# Patient Record
Sex: Male | Born: 1975 | Race: White | Hispanic: No | Smoking: Never smoker
Health system: Southern US, Community
[De-identification: ages and names within clinical notes are randomized; demographics above are authoritative.]

## PROBLEM LIST (undated history)

## (undated) DIAGNOSIS — R569 Unspecified convulsions: Secondary | ICD-10-CM

## (undated) DIAGNOSIS — N200 Calculus of kidney: Secondary | ICD-10-CM

## (undated) DIAGNOSIS — I5189 Other ill-defined heart diseases: Secondary | ICD-10-CM

## (undated) DIAGNOSIS — S42309A Unspecified fracture of shaft of humerus, unspecified arm, initial encounter for closed fracture: Secondary | ICD-10-CM

## (undated) DIAGNOSIS — R55 Syncope and collapse: Secondary | ICD-10-CM

## (undated) DIAGNOSIS — R011 Cardiac murmur, unspecified: Secondary | ICD-10-CM

## (undated) DIAGNOSIS — Z87448 Personal history of other diseases of urinary system: Secondary | ICD-10-CM

## (undated) HISTORY — DX: Cardiac murmur, unspecified: R01.1

## (undated) HISTORY — DX: Unspecified convulsions: R56.9

## (undated) HISTORY — DX: Unspecified fracture of shaft of humerus, unspecified arm, initial encounter for closed fracture: S42.309A

## (undated) HISTORY — DX: Syncope and collapse: R55

## (undated) HISTORY — DX: Other ill-defined heart diseases: I51.89

## (undated) HISTORY — DX: Calculus of kidney: N20.0

---
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
## 2006-10-08 ENCOUNTER — Ambulatory Visit: Payer: Self-pay | Admitting: Family Medicine

## 2006-12-13 ENCOUNTER — Ambulatory Visit: Payer: Self-pay | Admitting: Family Medicine

## 2011-05-31 ENCOUNTER — Ambulatory Visit (INDEPENDENT_AMBULATORY_CARE_PROVIDER_SITE_OTHER): Payer: Commercial Managed Care - PPO | Admitting: Family Medicine

## 2011-05-31 ENCOUNTER — Encounter: Payer: Self-pay | Admitting: Family Medicine

## 2011-05-31 VITALS — BP 120/78 | HR 72 | Temp 97.7°F | Ht 71.0 in | Wt 201.4 lb

## 2011-05-31 DIAGNOSIS — Z0000 Encounter for general adult medical examination without abnormal findings: Secondary | ICD-10-CM

## 2011-05-31 DIAGNOSIS — L739 Follicular disorder, unspecified: Secondary | ICD-10-CM

## 2011-05-31 DIAGNOSIS — Z13220 Encounter for screening for lipoid disorders: Secondary | ICD-10-CM

## 2011-05-31 DIAGNOSIS — R011 Cardiac murmur, unspecified: Secondary | ICD-10-CM | POA: Insufficient documentation

## 2011-05-31 DIAGNOSIS — Z131 Encounter for screening for diabetes mellitus: Secondary | ICD-10-CM

## 2011-05-31 DIAGNOSIS — H579 Unspecified disorder of eye and adnexa: Secondary | ICD-10-CM

## 2011-05-31 DIAGNOSIS — H5789 Other specified disorders of eye and adnexa: Secondary | ICD-10-CM

## 2011-05-31 NOTE — Progress Notes (Signed)
Jay Price, a 35 y.o. male presents today in the office for the following:   Complete physical examination as well as some other acute issues.  Preventative Health Maintenance Visit:  Health Maintenance Summary Reviewed and updated, unless pt declines services.  Tobacco History Reviewed. Alcohol: No concerns, no excessive use Exercise Habits: Some activity, rec at least 30 mins 5 times a week. Plays basketball several times a week. Otherwise physically active as well. STD concerns: no risk or activity to increase risk Drug Use: None Encouraged self-testicular check  No lab work in 4 or 5 years. New family history of colon cancer or prostate cancer.   1. Folliculitis: Has some spots on his arms and legs. A few weeks ago.  At this point, they have Carney Bern the way, and there is some minimal postinflammatory tissue present, but no acute infection. Worsens on his left thigh.  2. Eye irritation: R eye is staying red. R eye will be blood red. 6-8 weeks.  Went for about a week without contacts.  Eye MD appt - going to get No known trauma or injury. It is irritated, and his route but quite readily wakes up in the morning compared to the left, but both do have some conjunctival irritation.  3. 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  Diagnosed on echocardiogram, has not had any followup since then. Does get short winded and lightheaded sometimes when he is exercising.  General: Denies fever, chills, sweats. No significant weight loss. Eyes: above ENT: Denies earache, sore throat, and hoarseness. Cardiovascular: Denies chest pains, palpitations, but dyspnea on exertion as above Respiratory: Denies cough, dyspnea at rest,wheeezing Breast: no concerns about lumps GI: Denies nausea, vomiting, diarrhea, constipation, change in bowel habits, abdominal pain, melena, hematochezia GU: Denies penile discharge, ED, urinary flow / outflow problems. No STD  concerns. Musculoskeletal: Denies back pain, joint pain Derm: above Neuro: Denies  paresthesias, frequent falls, frequent headaches Psych: Denies depression, anxiety Endocrine: Denies cold intolerance, heat intolerance, polydipsia Heme: Denies enlarged lymph nodes Allergy: No hayfever   Physical Exam  Blood pressure 120/78, pulse 72, temperature 97.7 F (36.5 C), temperature source Oral, height 5\' 11"  (1.803 m), weight 201 lb 6.4 oz (91.354 kg), SpO2 98.00%.  PE: GEN: well developed, well nourished, no acute distress Eyes: conjunctiva and lids normal, PERRLA, EOMI ENT: TM clear, nares clear, oral exam WNL Neck: supple, no lymphadenopathy, no thyromegaly, no JVD Pulm: clear to auscultation and percussion, respiratory effort normal CV: regular rate and rhythm, S1-S2, no murmur, rub or gallop, no bruits, peripheral pulses normal and symmetric, no cyanosis, clubbing, edema or varicosities Chest: no scars, masses, no gynecomastia   GI: soft, non-tender; no hepatosplenomegaly, masses; active bowel sounds all quadrants GU: no hernia, testicular mass, penile discharge Lymph: no cervical, axillary or inguinal adenopathy MSK: gait normal, muscle tone and strength WNL, no joint swelling, effusions, discoloration, crepitus  SKIN: Scattered areas of healing small reddened elevations. No sign of occult infection currently  Neuro: normal mental status, normal strength, sensation, and motion Psych: alert; oriented to person, place and time, normally interactive and not anxious or depressed in appearance.  Assessment and Plan: 1.  health maintenance: The patient's preventative maintenance and recommended screening tests for an annual wellness exam were reviewed in full today. Brought up to date unless services declined.  Counselled on the importance of diet, exercise, and its role in overall health and mortality. The patient's FH and SH was reviewed, including their  home life, tobacco status, and  drug and alcohol status.   Check basic FLP and BMP  2. a history of abnormal valve functionality: Given his ongoing occasional shortness of breath, and his history, with an unclear diagnosis, it is very reasonable we'll follow this up with an echocardiogram to see if his valvular functionality has altered over the years.  3. Folliculitis, healed, reassured. If returns, he can use some Neosporin or triple antibiotic ointment.  4. Right eye irritation: Fluoroscein dye was placed into the patient's eye without any difficulty, and the patient tolerated this well. Ultraviolet light displayed no defect in the patient's eye. Given this ongoing problem, and redness in the eye, he is ago followup with his ophthalmologist, which I think is very prudent.

## 2011-05-31 NOTE — Patient Instructions (Signed)
REFERRAL: GO THE THE FRONT ROOM AT THE ENTRANCE OF OUR CLINIC, NEAR CHECK IN. ASK FOR MARION. SHE WILL HELP YOU SET UP YOUR REFERRAL. DATE: TIME:  

## 2011-06-01 LAB — BASIC METABOLIC PANEL
BUN: 18 mg/dL (ref 6–23)
CO2: 26 mEq/L (ref 19–32)
Chloride: 104 mEq/L (ref 96–112)
Creatinine, Ser: 1.1 mg/dL (ref 0.4–1.5)

## 2011-06-08 ENCOUNTER — Other Ambulatory Visit (INDEPENDENT_AMBULATORY_CARE_PROVIDER_SITE_OTHER): Payer: Commercial Managed Care - PPO | Admitting: *Deleted

## 2011-06-08 DIAGNOSIS — R011 Cardiac murmur, unspecified: Secondary | ICD-10-CM

## 2013-04-09 ENCOUNTER — Other Ambulatory Visit: Payer: Self-pay | Admitting: Family Medicine

## 2013-04-09 DIAGNOSIS — Z13220 Encounter for screening for lipoid disorders: Secondary | ICD-10-CM

## 2013-04-09 DIAGNOSIS — R5383 Other fatigue: Secondary | ICD-10-CM

## 2013-04-15 ENCOUNTER — Other Ambulatory Visit (INDEPENDENT_AMBULATORY_CARE_PROVIDER_SITE_OTHER): Payer: Commercial Managed Care - PPO

## 2013-04-15 DIAGNOSIS — Z13220 Encounter for screening for lipoid disorders: Secondary | ICD-10-CM

## 2013-04-15 DIAGNOSIS — R5381 Other malaise: Secondary | ICD-10-CM

## 2013-04-15 LAB — LIPID PANEL
Cholesterol: 188 mg/dL (ref 0–200)
HDL: 37.2 mg/dL — ABNORMAL LOW (ref >39.00)
LDL Cholesterol: 136 mg/dL — ABNORMAL HIGH (ref 0–99)
VLDL: 15.2 mg/dL (ref 0.0–40.0)

## 2013-04-15 LAB — CBC WITH DIFFERENTIAL/PLATELET
Basophils Absolute: 0 10*3/uL (ref 0.0–0.1)
Basophils Relative: 0.7 % (ref 0.0–3.0)
Eosinophils Absolute: 0.2 10*3/uL (ref 0.0–0.7)
Lymphocytes Relative: 35.8 % (ref 12.0–46.0)
MCHC: 33.3 g/dL (ref 30.0–36.0)
Neutrophils Relative %: 51.2 % (ref 43.0–77.0)
Platelets: 251 10*3/uL (ref 150.0–400.0)
RBC: 5.09 Mil/uL (ref 4.22–5.81)

## 2013-04-15 LAB — BASIC METABOLIC PANEL
CO2: 30 mEq/L (ref 19–32)
Calcium: 9.4 mg/dL (ref 8.4–10.5)
Creatinine, Ser: 1.1 mg/dL (ref 0.4–1.5)
GFR: 81.18 mL/min (ref >60.00)
Sodium: 138 mEq/L (ref 135–145)

## 2013-04-15 LAB — HEPATIC FUNCTION PANEL
Alkaline Phosphatase: 73 U/L (ref 39–117)
Bilirubin, Direct: 0.1 mg/dL (ref 0.0–0.3)
Total Bilirubin: 0.6 mg/dL (ref 0.3–1.2)

## 2013-04-22 ENCOUNTER — Ambulatory Visit (INDEPENDENT_AMBULATORY_CARE_PROVIDER_SITE_OTHER): Payer: Commercial Managed Care - PPO | Admitting: Family Medicine

## 2013-04-22 ENCOUNTER — Encounter: Payer: Self-pay | Admitting: Family Medicine

## 2013-04-22 VITALS — BP 120/64 | HR 64 | Temp 97.7°F | Ht 71.0 in | Wt 193.5 lb

## 2013-04-22 DIAGNOSIS — R1011 Right upper quadrant pain: Secondary | ICD-10-CM

## 2013-04-22 DIAGNOSIS — Z0000 Encounter for general adult medical examination without abnormal findings: Secondary | ICD-10-CM

## 2013-04-22 NOTE — Addendum Note (Signed)
Addended by: Consuello Masse on: 04/22/2013 10:05 AM   Modules accepted: Orders

## 2013-04-22 NOTE — Patient Instructions (Addendum)
REFERRAL: GO THE THE FRONT ROOM AT THE ENTRANCE OF OUR CLINIC, NEAR CHECK IN. ASK FOR MARION. SHE WILL HELP YOU SET UP YOUR REFERRAL. DATE: TIME:  

## 2013-04-22 NOTE — Progress Notes (Signed)
[facility] [address] [place] [address] Phone: [phone] Fax: [phone]  Date:  04/22/2013   Name:  Jay Price   DOB:  03-21-1976   MRN:  [id number] Gender: male Age: 37 y.o.  Primary Physician:  Hannah Beat, MD  Evaluating MD: Hannah Beat, MD   Chief Complaint: Annual Exam   History of Present Illness:  Jay Price is a 37 y.o. pleasant patient who presents with the following:  CPX:  Pzst few months has been having pains under his ribcage and will occaisionally hurt under rib cage. Couple of months. Few days ok, then shooting pain. Fast food will sometimes make it worse.   Preventative Health Maintenance Visit:  Health Maintenance Summary Reviewed and updated, unless pt declines services.  Tobacco History Reviewed. Alcohol: No concerns, no excessive use Exercise Habits: Some activity, rec at least 30 mins 5 times a week STD concerns: no risk or activity to increase risk Drug Use: None Encouraged self-testicular check  Health Maintenance  Topic Date Due  . Tetanus/tdap  11/29/1995  . Influenza Vaccine  07/20/2013    Labs reviewed with the patient.  Results for orders placed in visit on 04/15/13  LIPID PANEL      Result Value Range   Cholesterol 188  0 - 200 mg/dL   Triglycerides 24.4  0.0 - 149.0 mg/dL   HDL 01.02 (*) >72.53 mg/dL   VLDL 66.4  0.0 - 40.3 mg/dL   LDL Cholesterol 474 (*) 0 - 99 mg/dL   Total CHOL/HDL Ratio 5    CBC WITH DIFFERENTIAL      Result Value Range   WBC 6.3  4.5 - 10.5 K/uL   RBC 5.09  4.22 - 5.81 Mil/uL   Hemoglobin 15.2  13.0 - 17.0 g/dL   HCT 25.9  56.3 - 87.5 %   MCV 89.6  78.0 - 100.0 fl   MCHC 33.3  30.0 - 36.0 g/dL   RDW 64.3  32.9 - 51.8 %   Platelets 251.0  150.0 - 400.0 K/uL   Neutrophils Relative % 51.2  43.0 - 77.0 %   Lymphocytes Relative 35.8  12.0 - 46.0 %   Monocytes Relative 9.4  3.0 - 12.0 %   Eosinophils Relative 2.9  0.0 - 5.0 %   Basophils  Relative 0.7  0.0 - 3.0 %   Neutro Abs 3.2  1.4 - 7.7 K/uL   Lymphs Abs 2.2  0.7 - 4.0 K/uL   Monocytes Absolute 0.6  0.1 - 1.0 K/uL   Eosinophils Absolute 0.2  0.0 - 0.7 K/uL   Basophils Absolute 0.0  0.0 - 0.1 K/uL  HEPATIC FUNCTION PANEL      Result Value Range   Total Bilirubin 0.6  0.3 - 1.2 mg/dL   Bilirubin, Direct 0.1  0.0 - 0.3 mg/dL   Alkaline Phosphatase 73  39 - 117 U/L   AST 23  0 - 37 U/L   ALT 39  0 - 53 U/L   Total Protein 7.6  6.0 - 8.3 g/dL   Albumin 4.1  3.5 - 5.2 g/dL  BASIC METABOLIC PANEL      Result Value Range   Sodium 138  135 - 145 mEq/L   Potassium 4.5  3.5 - 5.1 mEq/L   Chloride 105  96 - 112 mEq/L   CO2 30  19 - 32 mEq/L   Glucose, Bld 108 (*) 70 - 99 mg/dL  BUN 14  6 - 23 mg/dL   Creatinine, Ser 1.1  0.4 - 1.5 mg/dL   Calcium 9.4  8.4 - 82.9 mg/dL   GFR 56.21  >30.86 mL/min    Patient Active Problem List   Diagnosis Date Noted  . Murmur, cardiac 05/31/2011    Past Medical History  Diagnosis Date  . Heart murmur     "leaky valve" seen on Echo in past  . Seizures     Distantly, had about 5 seizures, usually associated with great fatigue and strain. Last 10 years ago. Tonic-clonic activity described. Post-ictal state.  . Arm fracture     distant    No past surgical history on file.  History   Social History  . Marital Status: Married    Spouse Name: N/A    Number of Children: N/A  . Years of Education: N/A   Occupational History  . Not on file.   Social History Main Topics  . Smoking status: Never Smoker   . Smokeless tobacco: Not on file  . Alcohol Use: No  . Drug Use: No  . Sexually Active: Not on file   Other Topics Concern  . Not on file   Social History Narrative  . No narrative on file    No family history on file.  No Known Allergies  Medication list has been reviewed and updated.  No outpatient prescriptions prior to visit.   No facility-administered medications prior to visit.    Review of  Systems:   General: Denies fever, chills, sweats. No significant weight loss. Eyes: Denies blurring,significant itching ENT: Denies earache, sore throat, and hoarseness. Cardiovascular: Denies chest pains, palpitations, dyspnea on exertion Respiratory: Denies cough, dyspnea at rest,wheeezing Breast: no concerns about lumps GI: as above GU: Denies penile discharge, ED, urinary flow / outflow problems. No STD concerns. Musculoskeletal: Denies back pain, joint pain Derm: Denies rash, itching Neuro: Denies  paresthesias, frequent falls, frequent headaches Psych: Denies depression, anxiety Endocrine: Denies cold intolerance, heat intolerance, polydipsia Heme: Denies enlarged lymph nodes Allergy: No hayfever   Physical Examination: BP 120/64  Pulse 64  Temp(Src) 97.7 F (36.5 C) (Oral)  Ht 5\' 11"  (1.803 m)  Wt 193 lb 8 oz (87.771 kg)  BMI 27 kg/m2  SpO2 98%  Ideal Body Weight: Weight in (lb) to have BMI = 25: 178.9  GEN: well developed, well nourished, no acute distress Eyes: conjunctiva and lids normal, PERRLA, EOMI ENT: TM clear, nares clear, oral exam WNL Neck: supple, no lymphadenopathy, no thyromegaly, no JVD Pulm: clear to auscultation and percussion, respiratory effort normal CV: regular rate and rhythm, S1-S2, no murmur, rub or gallop, no bruits, peripheral pulses normal and symmetric, no cyanosis, clubbing, edema or varicosities GI: soft, non-tender; no hepatosplenomegaly, masses; active bowel sounds all quadrants GU: no hernia, testicular mass, penile discharge Lymph: no cervical, axillary or inguinal adenopathy MSK: gait normal, muscle tone and strength WNL, no joint swelling, effusions, discoloration, crepitus  SKIN: clear, good turgor, color WNL, no rashes, lesions, or ulcerations Neuro: normal mental status, normal strength, sensation, and motion Psych: alert; oriented to person, place and time, normally interactive and not anxious or depressed in  appearance.  Assessment and Plan:  Routine general medical examination at a health care facility  Abdominal pain, right upper quadrant - Plan: US Abdomen Complete  The patient's preventative maintenance and recommended screening tests for an annual wellness exam were reviewed in full today. Brought up to date unless services declined.  Counselled on  the importance of diet, exercise, and its role in overall health and mortality. The patient's FH and SH was reviewed, including their home life, tobacco status, and drug and alcohol status.   Work on losing weight and exercise.   RUQ abd intermittent pain, obtain u/s to eval for potential gallstones  Orders Today:  Orders Placed This Encounter  Procedures  . US Abdomen Complete    Standing Status: Future     Number of Occurrences:      Standing Expiration Date: 06/22/2014    Order Specific Question:  Reason for exam:    Answer:  concern for gallstones    Order Specific Question:  Preferred imaging location?    Answer:  [id number]    Updated Medication List: (Includes new medications, updates to list, dose adjustments) No orders of the defined types were placed in this encounter.    Medications Discontinued: There are no discontinued medications.    Signed, Elpidio Galea. Kaiven Vester, MD 04/22/2013 8:49 AM

## 2013-05-11 ENCOUNTER — Ambulatory Visit: Payer: Self-pay | Admitting: Family Medicine

## 2013-05-12 ENCOUNTER — Encounter: Payer: Self-pay | Admitting: Family Medicine

## 2013-05-13 ENCOUNTER — Telehealth: Payer: Self-pay | Admitting: Family Medicine

## 2013-05-13 NOTE — Telephone Encounter (Signed)
Patient advised as requested 

## 2013-05-13 NOTE — Telephone Encounter (Signed)
Please call and let the patient know that his ultrasound was completely normal. Normal gallbladder. I am not sure I would do much. He could do a trial of Zantac for a few weeks bid to see if this helps.

## 2014-12-17 ENCOUNTER — Other Ambulatory Visit: Payer: Self-pay | Admitting: Family Medicine

## 2014-12-17 DIAGNOSIS — Z13220 Encounter for screening for lipoid disorders: Secondary | ICD-10-CM

## 2014-12-17 DIAGNOSIS — Z79899 Other long term (current) drug therapy: Secondary | ICD-10-CM

## 2014-12-20 ENCOUNTER — Other Ambulatory Visit (INDEPENDENT_AMBULATORY_CARE_PROVIDER_SITE_OTHER): Payer: Commercial Managed Care - PPO

## 2014-12-20 DIAGNOSIS — Z13220 Encounter for screening for lipoid disorders: Secondary | ICD-10-CM

## 2014-12-20 DIAGNOSIS — Z79899 Other long term (current) drug therapy: Secondary | ICD-10-CM

## 2014-12-20 LAB — LIPID PANEL
CHOL/HDL RATIO: 4
CHOLESTEROL: 157 mg/dL (ref 0–200)
HDL: 39.3 mg/dL (ref >39.00)
LDL CALC: 91 mg/dL (ref 0–99)
NonHDL: 117.7
TRIGLYCERIDES: 132 mg/dL (ref 0.0–149.0)
VLDL: 26.4 mg/dL (ref 0.0–40.0)

## 2014-12-20 LAB — CBC WITH DIFFERENTIAL/PLATELET
BASOS PCT: 0.5 % (ref 0.0–3.0)
Basophils Absolute: 0 10*3/uL (ref 0.0–0.1)
EOS ABS: 0.2 10*3/uL (ref 0.0–0.7)
EOS PCT: 2.6 % (ref 0.0–5.0)
HCT: 42.3 % (ref 39.0–52.0)
HEMOGLOBIN: 14.5 g/dL (ref 13.0–17.0)
LYMPHS PCT: 32.1 % (ref 12.0–46.0)
Lymphs Abs: 2.3 10*3/uL (ref 0.7–4.0)
MCHC: 34.3 g/dL (ref 30.0–36.0)
MCV: 87.6 fl (ref 78.0–100.0)
Monocytes Absolute: 0.7 10*3/uL (ref 0.1–1.0)
Monocytes Relative: 10.4 % (ref 3.0–12.0)
NEUTROS PCT: 54.4 % (ref 43.0–77.0)
Neutro Abs: 3.9 10*3/uL (ref 1.4–7.7)
Platelets: 270 10*3/uL (ref 150.0–400.0)
RBC: 4.82 Mil/uL (ref 4.22–5.81)
RDW: 13.2 % (ref 11.5–15.5)
WBC: 7.2 10*3/uL (ref 4.0–10.5)

## 2014-12-20 LAB — BASIC METABOLIC PANEL
BUN: 16 mg/dL (ref 6–23)
CO2: 30 mEq/L (ref 19–32)
CREATININE: 1 mg/dL (ref 0.40–1.50)
Calcium: 9.3 mg/dL (ref 8.4–10.5)
Chloride: 105 mEq/L (ref 96–112)
GFR: 88.85 mL/min (ref >60.00)
Glucose, Bld: 93 mg/dL (ref 70–99)
POTASSIUM: 4.2 meq/L (ref 3.5–5.1)
Sodium: 140 mEq/L (ref 135–145)

## 2014-12-20 LAB — HEPATIC FUNCTION PANEL
ALBUMIN: 4.2 g/dL (ref 3.5–5.2)
ALK PHOS: 72 U/L (ref 39–117)
ALT: 38 U/L (ref 0–53)
AST: 25 U/L (ref 0–37)
BILIRUBIN DIRECT: 0.1 mg/dL (ref 0.0–0.3)
BILIRUBIN TOTAL: 0.6 mg/dL (ref 0.2–1.2)
TOTAL PROTEIN: 7.1 g/dL (ref 6.0–8.3)

## 2014-12-27 ENCOUNTER — Encounter: Payer: Self-pay | Admitting: Family Medicine

## 2014-12-27 ENCOUNTER — Ambulatory Visit (INDEPENDENT_AMBULATORY_CARE_PROVIDER_SITE_OTHER): Payer: Commercial Managed Care - PPO | Admitting: Family Medicine

## 2014-12-27 VITALS — BP 110/70 | HR 65 | Temp 97.4°F | Ht 71.5 in | Wt 184.5 lb

## 2014-12-27 DIAGNOSIS — Z0000 Encounter for general adult medical examination without abnormal findings: Secondary | ICD-10-CM

## 2014-12-27 NOTE — Progress Notes (Signed)
Pre visit review using our clinic review tool, if applicable. No additional management support is needed unless otherwise documented below in the visit note. 

## 2014-12-27 NOTE — Progress Notes (Signed)
 Dr. Spencer T. Copland, MD, [facility] [address] [place], [address] Phone: [phone] Fax: [phone]  12/27/2014  Patient: Jay Price., MRN: [id number], DOB: 02/23/1976, 39 y.o.  Primary Physician:  Spencer Copland, MD  Chief Complaint: Annual Exam  Subjective:   Jay Price. is a 39 y.o. pleasant patient who presents with the following:  Preventative Health Maintenance Visit:  Health Maintenance Summary Reviewed and updated, unless pt declines services.  Tobacco History Reviewed. Alcohol: No concerns, no excessive use Exercise Habits: Some activity, rec at least 30 mins 5 times a week STD concerns: no risk or activity to increase risk Drug Use: None Encouraged self-testicular check  Working 2 jobs right now.  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  Still has some stomach pain under his ribs.   Health Maintenance  Topic Date Due  . TETANUS/TDAP  11/29/1995  . INFLUENZA VACCINE  06/19/2014    There is no immunization history on file for this patient. Patient Active Problem List   Diagnosis Date Noted  . Murmur, cardiac 05/31/2011   Past Medical History  Diagnosis Date  . Heart murmur     "leaky valve" seen on Echo in past  . Seizures     Distantly, had about 5 seizures, usually associated with great fatigue and strain. Last 10 years ago. Tonic-clonic activity described. Post-ictal state.  . Arm fracture     distant   No past surgical history on file. History   Social History  . Marital Status: Married    Spouse Name: N/A    Number of Children: N/A  . Years of Education: N/A   Occupational History  . Not on file.   Social History Main Topics  . Smoking status: Never Smoker   . Smokeless tobacco: Never Used  . Alcohol Use: No  . Drug Use: No  . Sexual Activity: Not on file   Other Topics Concern  . Not on file   Social History Narrative   No family  history on file. No Known Allergies  Medication list has been reviewed and updated.   General: Denies fever, chills, sweats. No significant weight loss. Eyes: Denies blurring,significant itching ENT: Denies earache, sore throat, and hoarseness. Cardiovascular: Denies chest pains, palpitations, dyspnea on exertion Respiratory: Denies cough, dyspnea at rest,wheeezing Breast: no concerns about lumps GI: Denies nausea, vomiting, diarrhea, constipation, change in bowel habits, abdominal pain, melena, hematochezia GU: Denies penile discharge, ED, urinary flow / outflow problems. No STD concerns. Musculoskeletal: Denies back pain, joint pain Derm: Denies rash, itching Neuro: Denies  paresthesias, frequent falls, frequent headaches Psych: Denies depression, anxiety Endocrine: Denies cold intolerance, heat intolerance, polydipsia Heme: Denies enlarged lymph nodes Allergy: No hayfever  Objective:   BP 110/70 mmHg  Pulse 65  Temp(Src) 97.4 F (36.3 C) (Oral)  Ht 5' 11.5" (1.816 m)  Wt 184 lb 8 oz (83.689 kg)  BMI 25.38 kg/m2 Ideal Body Weight: Weight in (lb) to have BMI = 25: 181.4  No exam data present  GEN: well developed, well nourished, no acute distress Eyes: conjunctiva and lids normal, PERRLA, EOMI ENT: TM clear, nares clear, oral exam WNL Neck: supple, no lymphadenopathy, no thyromegaly, no JVD Pulm: clear to auscultation and percussion, respiratory effort normal CV: regular rate and rhythm, S1-S2, no murmur, rub or gallop, no bruits, peripheral pulses normal and symmetric, no cyanosis, clubbing, edema or varicosities GI: soft, non-tender; no   hepatosplenomegaly, masses; active bowel sounds all quadrants GU: no hernia, testicular mass, penile discharge Lymph: no cervical, axillary or inguinal adenopathy MSK: gait normal, muscle tone and strength WNL, no joint swelling, effusions, discoloration, crepitus  SKIN: clear, good turgor, color WNL, no rashes, lesions, or  ulcerations Neuro: normal mental status, normal strength, sensation, and motion Psych: alert; oriented to person, place and time, normally interactive and not anxious or depressed in appearance. All labs reviewed with patient.  Lipids:    Component Value Date/Time   CHOL 157 12/20/2014 0851   TRIG 132.0 12/20/2014 0851   HDL 39.30 12/20/2014 0851   LDLDIRECT 135.4 05/31/2011 1523   VLDL 26.4 12/20/2014 0851   CHOLHDL 4 12/20/2014 0851   CBC: CBC Latest Ref Rng 12/20/2014 04/15/2013  WBC 4.0 - 10.5 K/uL 7.2 6.3  Hemoglobin 13.0 - 17.0 g/dL 14.5 15.2  Hematocrit 39.0 - 52.0 % 42.3 45.6  Platelets 150.0 - 400.0 K/uL 270.0 756.4    Basic Metabolic Panel:    Component Value Date/Time   NA 140 12/20/2014 0851   K 4.2 12/20/2014 0851   CL 105 12/20/2014 0851   CO2 30 12/20/2014 0851   BUN 16 12/20/2014 0851   CREATININE 1.00 12/20/2014 0851   GLUCOSE 93 12/20/2014 0851   CALCIUM 9.3 12/20/2014 0851   Hepatic Function Latest Ref Rng 12/20/2014 04/15/2013  Total Protein 6.0 - 8.3 g/dL 7.1 7.6  Albumin 3.5 - 5.2 g/dL 4.2 4.1  AST 0 - 37 U/L 25 23  ALT 0 - 53 U/L 38 39  Alk Phosphatase 39 - 117 U/L 72 73  Total Bilirubin 0.2 - 1.2 mg/dL 0.6 0.6  Bilirubin, Direct 0.0 - 0.3 mg/dL 0.1 0.1    No results found for: TSH No results found for: PSA  Assessment and Plan:   Routine general medical examination at a health care facility  Health Maintenance Exam: The patient's preventative maintenance and recommended screening tests for Jay annual wellness exam were reviewed in full today. Brought up to date unless services declined.  Counselled on the importance of diet, exercise, and its role in overall health and mortality. The patient's FH and SH was reviewed, including their home life, tobacco status, and drug and alcohol status.  Doing well  Follow-up: Return in 1 year (on 12/28/2015). Unless noted, follow-up in 1 year for Health Maintenance Exam.  New Prescriptions   No  medications on file   No orders of the defined types were placed in this encounter.    Signed,  Maud Deed. Viliami Bracco, MD   Patient's Medications   No medications on file

## 2015-06-27 ENCOUNTER — Ambulatory Visit (INDEPENDENT_AMBULATORY_CARE_PROVIDER_SITE_OTHER): Payer: Commercial Managed Care - PPO | Admitting: Family Medicine

## 2015-06-27 ENCOUNTER — Encounter: Payer: Self-pay | Admitting: Family Medicine

## 2015-06-27 VITALS — BP 110/70 | HR 72 | Temp 98.7°F | Wt 188.8 lb

## 2015-06-27 DIAGNOSIS — R195 Other fecal abnormalities: Secondary | ICD-10-CM | POA: Diagnosis not present

## 2015-06-27 DIAGNOSIS — M545 Low back pain, unspecified: Secondary | ICD-10-CM

## 2015-06-27 DIAGNOSIS — R1084 Generalized abdominal pain: Secondary | ICD-10-CM | POA: Diagnosis not present

## 2015-06-27 LAB — POCT URINALYSIS DIPSTICK
Bilirubin, UA: NEGATIVE
GLUCOSE UA: NEGATIVE
Ketones, UA: NEGATIVE
Leukocytes, UA: NEGATIVE
Nitrite, UA: NEGATIVE
PH UA: 6
Protein, UA: NEGATIVE
RBC UA: NEGATIVE
SPEC GRAV UA: 1.025
Urobilinogen, UA: 0.2

## 2015-06-27 NOTE — Progress Notes (Signed)
Pre visit review using our clinic review tool, if applicable. No additional management support is needed unless otherwise documented below in the visit note. 

## 2015-06-27 NOTE — Progress Notes (Signed)
Dr. Frederico Hamman T. Nahmir Zeidman, MD, [facility] and Sports Medicine [place], [address] Phone: [phone] Fax: [phone]  06/27/2015  Patient: Jay Price., MRN: [id number], DOB: 1976/05/07, 39 y.o.  Primary Physician:  Owens Loffler, MD  Chief Complaint: Back Pain  Subjective:   Jay Price. is a 39 y.o. very pleasant male patient who presents with the following:  Off and on has had some stomach issues.  About a week and a half ago, and he got a pain in the middle of his back. Woke him up out of a dead sleep.  Woke up out of a dead sleep.  Had a white stool, and has not seen it again.   Since that day, it has not happened again. Now it has resolved.   He also had an issue where he had some pain really on his right flank that lasted one day, but it resolved completely and he hasn't had any more and more than a week.  He did not lose any kind of blood, and now he feels perfectly normal.  Past Medical History, Surgical History, Social History, Family History, Problem List, Medications, and Allergies have been reviewed and updated if relevant.  Patient Active Problem List   Diagnosis Date Noted  . Murmur, cardiac 05/31/2011    Past Medical History  Diagnosis Date  . Heart murmur     "leaky valve" seen on Echo in past  . Seizures     Distantly, had about 5 seizures, usually associated with great fatigue and strain. Last 10 years ago. Tonic-clonic activity described. Post-ictal state.  . Arm fracture     distant    No past surgical history on file.  History   Social History  . Marital Status: Married    Spouse Name: N/A  . Number of Children: N/A  . Years of Education: N/A   Occupational History  . Not on file.   Social History Main Topics  . Smoking status: Never Smoker   . Smokeless tobacco: Never Used  . Alcohol Use: No  . Drug Use: No  . Sexual Activity: Not on file   Other Topics Concern  . Not on file    Social History Narrative    No family history on file.  No Known Allergies  Medication list reviewed and updated in full in [place].  ROS: GEN: Acute illness details above GI: Tolerating PO intake GU: maintaining adequate hydration and urination Pulm: No SOB Interactive and getting along well at home.  Otherwise, ROS is as per the HPI.   Objective:   BP 110/70 mmHg  Pulse 72  Temp(Src) 98.7 F (37.1 C) (Oral)  Wt 188 lb 12 oz (85.616 kg)  GEN: WDWN, NAD, Non-toxic, A & O x 3 HEENT: Atraumatic, Normocephalic. Neck supple. No masses, No LAD. Ears and Nose: No external deformity. CV: RRR, No M/G/R. No JVD. No thrill. No extra heart sounds. PULM: CTA B, no wheezes, crackles, rhonchi. No retractions. No resp. distress. No accessory muscle use. ABD: S, NT, ND, +BS. No rebound. No HSM. EXTR: No c/c/e NEURO Normal gait.  PSYCH: Normally interactive. Conversant. Not depressed or anxious appearing.  Calm demeanor.     Laboratory and Imaging Data: Results for orders placed or performed in visit on 24/09/73  Basic metabolic panel  Result Value Ref Range   Sodium 138 135 - 145 mEq/L   Potassium 4.5 3.5 - 5.1 mEq/L  Chloride 101 96 - 112 mEq/L   CO2 29 19 - 32 mEq/L   Glucose, Bld 104 (H) 70 - 99 mg/dL   BUN 15 6 - 23 mg/dL   Creatinine, Ser 1.37 0.40 - 1.50 mg/dL   Calcium 9.5 8.4 - 10.5 mg/dL   GFR 61.62 >60.00 mL/min  CBC with Differential/Platelet  Result Value Ref Range   WBC 9.3 4.0 - 10.5 K/uL   RBC 5.06 4.22 - 5.81 Mil/uL   Hemoglobin 15.4 13.0 - 17.0 g/dL   HCT 45.3 39.0 - 52.0 %   MCV 89.4 78.0 - 100.0 fl   MCHC 34.0 30.0 - 36.0 g/dL   RDW 12.6 11.5 - 15.5 %   Platelets 270.0 150.0 - 400.0 K/uL   Neutrophils Relative % 65.3 43.0 - 77.0 %   Lymphocytes Relative 26.7 12.0 - 46.0 %   Monocytes Relative 5.8 3.0 - 12.0 %   Eosinophils Relative 1.6 0.0 - 5.0 %   Basophils Relative 0.6 0.0 - 3.0 %   Neutro Abs 6.0 1.4 - 7.7 K/uL   Lymphs Abs 2.5  0.7 - 4.0 K/uL   Monocytes Absolute 0.5 0.1 - 1.0 K/uL   Eosinophils Absolute 0.1 0.0 - 0.7 K/uL   Basophils Absolute 0.1 0.0 - 0.1 K/uL  Hepatic function panel  Result Value Ref Range   Total Bilirubin 0.4 0.2 - 1.2 mg/dL   Bilirubin, Direct 0.1 0.0 - 0.3 mg/dL   Alkaline Phosphatase 68 39 - 117 U/L   AST 24 0 - 37 U/L   ALT 30 0 - 53 U/L   Total Protein 7.7 6.0 - 8.3 g/dL   Albumin 4.6 3.5 - 5.2 g/dL  Lipase  Result Value Ref Range   Lipase 30.0 11.0 - 59.0 U/L  POCT Urinalysis Dipstick  Result Value Ref Range   Color, UA Yellow    Clarity, UA Clear    Glucose, UA Negative    Bilirubin, UA Negative    Ketones, UA Negative    Spec Grav, UA 1.025    Blood, UA Negative    pH, UA 6.0    Protein, UA Negative    Urobilinogen, UA 0.2    Nitrite, UA Negative    Leukocytes, UA Negative Negative     Assessment and Plan:   Abdominal pain, generalized - Plan: Basic metabolic panel, CBC with Differential/Platelet, Hepatic function panel, Lipase  Right-sided low back pain without sciatica - Plan: POCT Urinalysis Dipstick, Basic metabolic panel, CBC with Differential/Platelet, Hepatic function panel, Lipase  Stool clay color - Plan: Basic metabolic panel, CBC with Differential/Platelet, Hepatic function panel, Lipase  He may have had an isolated gallstone, but now certainly he is not having any symptoms at all, all of his labs are normal, and I don't think that we need to do anything further unless he becomes symptomatic again.  Follow-up: No Follow-up on file.  New Prescriptions   No medications on file   Orders Placed This Encounter  Procedures  . Basic metabolic panel  . CBC with Differential/Platelet  . Hepatic function panel  . Lipase  . POCT Urinalysis Dipstick    Signed,  Frederico Hamman T. Nicholette Dolson, MD   Patient's Medications   No medications on file

## 2015-06-28 ENCOUNTER — Encounter: Payer: Self-pay | Admitting: *Deleted

## 2015-06-28 LAB — CBC WITH DIFFERENTIAL/PLATELET
BASOS PCT: 0.6 % (ref 0.0–3.0)
Basophils Absolute: 0.1 10*3/uL (ref 0.0–0.1)
Eosinophils Absolute: 0.1 10*3/uL (ref 0.0–0.7)
Eosinophils Relative: 1.6 % (ref 0.0–5.0)
HEMATOCRIT: 45.3 % (ref 39.0–52.0)
Hemoglobin: 15.4 g/dL (ref 13.0–17.0)
Lymphocytes Relative: 26.7 % (ref 12.0–46.0)
Lymphs Abs: 2.5 10*3/uL (ref 0.7–4.0)
MCHC: 34 g/dL (ref 30.0–36.0)
MCV: 89.4 fl (ref 78.0–100.0)
MONO ABS: 0.5 10*3/uL (ref 0.1–1.0)
Monocytes Relative: 5.8 % (ref 3.0–12.0)
NEUTROS PCT: 65.3 % (ref 43.0–77.0)
Neutro Abs: 6 10*3/uL (ref 1.4–7.7)
PLATELETS: 270 10*3/uL (ref 150.0–400.0)
RBC: 5.06 Mil/uL (ref 4.22–5.81)
RDW: 12.6 % (ref 11.5–15.5)
WBC: 9.3 10*3/uL (ref 4.0–10.5)

## 2015-06-28 LAB — BASIC METABOLIC PANEL
BUN: 15 mg/dL (ref 6–23)
CO2: 29 meq/L (ref 19–32)
CREATININE: 1.37 mg/dL (ref 0.40–1.50)
Calcium: 9.5 mg/dL (ref 8.4–10.5)
Chloride: 101 mEq/L (ref 96–112)
GFR: 61.62 mL/min (ref >60.00)
GLUCOSE: 104 mg/dL — AB (ref 70–99)
Potassium: 4.5 mEq/L (ref 3.5–5.1)
SODIUM: 138 meq/L (ref 135–145)

## 2015-06-28 LAB — HEPATIC FUNCTION PANEL
ALT: 30 U/L (ref 0–53)
AST: 24 U/L (ref 0–37)
Albumin: 4.6 g/dL (ref 3.5–5.2)
Alkaline Phosphatase: 68 U/L (ref 39–117)
BILIRUBIN DIRECT: 0.1 mg/dL (ref 0.0–0.3)
BILIRUBIN TOTAL: 0.4 mg/dL (ref 0.2–1.2)
TOTAL PROTEIN: 7.7 g/dL (ref 6.0–8.3)

## 2015-06-28 LAB — LIPASE: Lipase: 30 U/L (ref 11.0–59.0)

## 2016-01-04 ENCOUNTER — Ambulatory Visit (INDEPENDENT_AMBULATORY_CARE_PROVIDER_SITE_OTHER): Payer: Commercial Managed Care - PPO | Admitting: Family Medicine

## 2016-01-04 ENCOUNTER — Encounter: Payer: Self-pay | Admitting: Family Medicine

## 2016-01-04 ENCOUNTER — Other Ambulatory Visit: Payer: Self-pay | Admitting: Family Medicine

## 2016-01-04 VITALS — BP 120/80 | HR 62 | Temp 98.1°F | Ht 71.5 in | Wt 193.5 lb

## 2016-01-04 DIAGNOSIS — Z13220 Encounter for screening for lipoid disorders: Secondary | ICD-10-CM

## 2016-01-04 DIAGNOSIS — Z0000 Encounter for general adult medical examination without abnormal findings: Secondary | ICD-10-CM

## 2016-01-04 DIAGNOSIS — Z131 Encounter for screening for diabetes mellitus: Secondary | ICD-10-CM

## 2016-01-04 DIAGNOSIS — Z7721 Contact with and (suspected) exposure to potentially hazardous body fluids: Secondary | ICD-10-CM | POA: Diagnosis not present

## 2016-01-04 LAB — BASIC METABOLIC PANEL
BUN: 19 mg/dL (ref 6–23)
CO2: 29 mEq/L (ref 19–32)
CREATININE: 0.97 mg/dL (ref 0.40–1.50)
Calcium: 9.6 mg/dL (ref 8.4–10.5)
Chloride: 103 mEq/L (ref 96–112)
GFR: 91.53 mL/min (ref >60.00)
Glucose, Bld: 112 mg/dL — ABNORMAL HIGH (ref 70–99)
Potassium: 4.4 mEq/L (ref 3.5–5.1)
Sodium: 139 mEq/L (ref 135–145)

## 2016-01-04 LAB — LIPID PANEL
CHOLESTEROL: 195 mg/dL (ref 0–200)
HDL: 39.5 mg/dL (ref >39.00)
LDL CALC: 133 mg/dL — AB (ref 0–99)
NonHDL: 155.86
Total CHOL/HDL Ratio: 5
Triglycerides: 112 mg/dL (ref 0.0–149.0)
VLDL: 22.4 mg/dL (ref 0.0–40.0)

## 2016-01-04 NOTE — Progress Notes (Signed)
Dr. Frederico Hamman T. Thane Age, MD, [facility] Primary [facility] and Sports Medicine [place], [address] Phone: [phone] Fax: [phone]  01/04/2016  Patient: Jay Price., MRN: [id number], DOB: 1976-05-30, 40 y.o.  Primary Physician:  Owens Loffler, MD   Chief Complaint  Patient presents with  . Annual Exam   Subjective:   Jay Price. is a 40 y.o. pleasant patient who presents with the following:  Preventative Health Maintenance Visit:  Health Maintenance Summary Reviewed and updated, unless pt declines services.  Tobacco History Reviewed. Alcohol: No concerns, no excessive use Exercise Habits: Some activity, rec at least 30 mins 5 times a week STD concerns: no risk or activity to increase risk Drug Use: None Encouraged self-testicular check  BMP Lipid HIV?  Wants to get STD.   Seeing a new girlfriend now.  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   Health Maintenance  Topic Date Due  . HIV Screening  11/29/1991  . TETANUS/TDAP  11/29/1995  . INFLUENZA VACCINE  02/17/2016 (Originally 06/20/2015)    There is no immunization history on file for this patient. Patient Active Problem List   Diagnosis Date Noted  . Murmur, cardiac 05/31/2011   Past Medical History  Diagnosis Date  . Heart murmur     "leaky valve" seen on Echo in past  . Seizures     Distantly, had about 5 seizures, usually associated with great fatigue and strain. Last 10 years ago. Tonic-clonic activity described. Post-ictal state.  . Arm fracture     distant   No past surgical history on file. Social History   Social History  . Marital Status: Married    Spouse Name: N/A  . Number of Children: N/A  . Years of Education: N/A   Occupational History  . Not on file.   Social History Main Topics  . Smoking status: Never Smoker   . Smokeless tobacco: Never Used  . Alcohol Use: No  . Drug Use: No  . Sexual Activity: Not on  file   Other Topics Concern  . Not on file   Social History Narrative   No family history on file. No Known Allergies  Medication list has been reviewed and updated.   General: Denies fever, chills, sweats. No significant weight loss. Eyes: Denies blurring,significant itching ENT: Denies earache, sore throat, and hoarseness. Cardiovascular: Denies chest pains, palpitations, dyspnea on exertion Respiratory: Denies cough, dyspnea at rest,wheeezing Breast: no concerns about lumps GI: Denies nausea, vomiting, diarrhea, constipation, change in bowel habits, abdominal pain, melena, hematochezia GU: Denies penile discharge, ED, urinary flow / outflow problems. No STD concerns. Musculoskeletal: Denies back pain, joint pain Derm: Denies rash, itching Neuro: Denies  paresthesias, frequent falls, frequent headaches Psych: Denies depression, anxiety Endocrine: Denies cold intolerance, heat intolerance, polydipsia Heme: Denies enlarged lymph nodes Allergy: No hayfever  Objective:   BP 120/80 mmHg  Pulse 62  Temp(Src) 98.1 F (36.7 C) (Oral)  Ht 5' 11.5" (1.816 m)  Wt 193 lb 8 oz (87.771 kg)  BMI 26.61 kg/m2 Ideal Body Weight: Weight in (lb) to have BMI = 25: 181.4  No exam data present  GEN: well developed, well nourished, no acute distress Eyes: conjunctiva and lids normal, PERRLA, EOMI ENT: TM clear, nares clear, oral exam WNL Neck: supple, no lymphadenopathy, no thyromegaly, no JVD Pulm: clear to auscultation and percussion, respiratory effort normal CV: regular rate and rhythm, S1-S2, no murmur, rub  or gallop, no bruits, peripheral pulses normal and symmetric, no cyanosis, clubbing, edema or varicosities GI: soft, non-tender; no hepatosplenomegaly, masses; active bowel sounds all quadrants GU: no hernia, testicular mass, penile discharge Lymph: no cervical, axillary or inguinal adenopathy MSK: gait normal, muscle tone and strength WNL, no joint swelling, effusions,  discoloration, crepitus  SKIN: clear, good turgor, color WNL, no rashes, lesions, or ulcerations Neuro: normal mental status, normal strength, sensation, and motion Psych: alert; oriented to person, place and time, normally interactive and not anxious or depressed in appearance. All labs reviewed with patient.  Lipids:    Component Value Date/Time   CHOL 157 12/20/2014 0851   TRIG 132.0 12/20/2014 0851   HDL 39.30 12/20/2014 0851   LDLDIRECT 135.4 05/31/2011 1523   VLDL 26.4 12/20/2014 0851   CHOLHDL 4 12/20/2014 0851   CBC: CBC Latest Ref Rng 06/27/2015 12/20/2014 04/15/2013  WBC 4.0 - 10.5 K/uL 9.3 7.2 6.3  Hemoglobin 13.0 - 17.0 g/dL 15.4 14.5 15.2  Hematocrit 39.0 - 52.0 % 45.3 42.3 45.6  Platelets 150.0 - 400.0 K/uL 270.0 270.0 825.0    Basic Metabolic Panel:    Component Value Date/Time   NA 138 06/27/2015 1603   K 4.5 06/27/2015 1603   CL 101 06/27/2015 1603   CO2 29 06/27/2015 1603   BUN 15 06/27/2015 1603   CREATININE 1.37 06/27/2015 1603   GLUCOSE 104* 06/27/2015 1603   CALCIUM 9.5 06/27/2015 1603   Hepatic Function Latest Ref Rng 06/27/2015 12/20/2014 04/15/2013  Total Protein 6.0 - 8.3 g/dL 7.7 7.1 7.6  Albumin 3.5 - 5.2 g/dL 4.6 4.2 4.1  AST 0 - 37 U/L 24 25 23   ALT 0 - 53 U/L 30 38 39  Alk Phosphatase 39 - 117 U/L 68 72 73  Total Bilirubin 0.2 - 1.2 mg/dL 0.4 0.6 0.6  Bilirubin, Direct 0.0 - 0.3 mg/dL 0.1 0.1 0.1    No results found for: TSH No results found for: PSA  Assessment and Plan:   Healthcare maintenance  Hx of exposure to hazardous bodily fluids - Plan: HIV antibody, RPR, GC/chlamydia probe amp, urine, Hepatitis C antibody  Screening, lipid - Plan: Lipid panel  Screening for diabetes mellitus - Plan: Basic metabolic panel  Health Maintenance Exam: The patient's preventative maintenance and recommended screening tests for an annual wellness exam were reviewed in full today. Brought up to date unless services declined.  Counselled on the  importance of diet, exercise, and its role in overall health and mortality. The patient's FH and SH was reviewed, including their home life, tobacco status, and drug and alcohol status.  Follow-up: No Follow-up on file. Unless noted, follow-up in 1 year for Health Maintenance Exam.  New Prescriptions   No medications on file   Modified Medications   No medications on file   Orders Placed This Encounter  Procedures  . Basic metabolic panel  . Lipid panel  . HIV antibody  . RPR  . GC/chlamydia probe amp, urine  . Hepatitis C antibody    Signed,  Navia Lindahl T. Juliana Boling, MD   Patient's Medications   No medications on file

## 2016-01-04 NOTE — Progress Notes (Signed)
Pre visit review using our clinic review tool, if applicable. No additional management support is needed unless otherwise documented below in the visit note. 

## 2016-01-05 LAB — HEPATITIS C ANTIBODY: HCV AB: NEGATIVE

## 2016-01-05 LAB — GC/CHLAMYDIA PROBE AMP
CT PROBE, AMP APTIMA: NOT DETECTED
GC PROBE AMP APTIMA: NOT DETECTED

## 2016-01-05 LAB — RPR

## 2016-01-05 LAB — HIV ANTIBODY (ROUTINE TESTING W REFLEX): HIV 1&2 Ab, 4th Generation: NONREACTIVE

## 2016-01-06 ENCOUNTER — Encounter: Payer: Self-pay | Admitting: *Deleted

## 2016-12-28 ENCOUNTER — Other Ambulatory Visit: Payer: Self-pay | Admitting: Family Medicine

## 2016-12-28 DIAGNOSIS — Z13220 Encounter for screening for lipoid disorders: Secondary | ICD-10-CM

## 2016-12-28 DIAGNOSIS — Z0000 Encounter for general adult medical examination without abnormal findings: Secondary | ICD-10-CM

## 2017-01-04 ENCOUNTER — Other Ambulatory Visit (INDEPENDENT_AMBULATORY_CARE_PROVIDER_SITE_OTHER): Payer: Commercial Managed Care - PPO

## 2017-01-04 DIAGNOSIS — Z0000 Encounter for general adult medical examination without abnormal findings: Secondary | ICD-10-CM | POA: Diagnosis not present

## 2017-01-04 DIAGNOSIS — Z13220 Encounter for screening for lipoid disorders: Secondary | ICD-10-CM | POA: Diagnosis not present

## 2017-01-04 DIAGNOSIS — R7989 Other specified abnormal findings of blood chemistry: Secondary | ICD-10-CM

## 2017-01-04 LAB — CBC WITH DIFFERENTIAL/PLATELET
BASOS PCT: 0.9 % (ref 0.0–3.0)
Basophils Absolute: 0.1 10*3/uL (ref 0.0–0.1)
Eosinophils Absolute: 0.2 10*3/uL (ref 0.0–0.7)
Eosinophils Relative: 3 % (ref 0.0–5.0)
HEMATOCRIT: 43.5 % (ref 39.0–52.0)
Hemoglobin: 15 g/dL (ref 13.0–17.0)
LYMPHS ABS: 2.8 10*3/uL (ref 0.7–4.0)
LYMPHS PCT: 40.9 % (ref 12.0–46.0)
MCHC: 34.6 g/dL (ref 30.0–36.0)
MCV: 88.5 fl (ref 78.0–100.0)
MONOS PCT: 11.1 % (ref 3.0–12.0)
Monocytes Absolute: 0.7 10*3/uL (ref 0.1–1.0)
NEUTROS ABS: 3 10*3/uL (ref 1.4–7.7)
Neutrophils Relative %: 44.1 % (ref 43.0–77.0)
PLATELETS: 301 10*3/uL (ref 150.0–400.0)
RBC: 4.91 Mil/uL (ref 4.22–5.81)
RDW: 13.1 % (ref 11.5–15.5)
WBC: 6.8 10*3/uL (ref 4.0–10.5)

## 2017-01-04 LAB — BASIC METABOLIC PANEL
BUN: 13 mg/dL (ref 6–23)
CHLORIDE: 105 meq/L (ref 96–112)
CO2: 29 mEq/L (ref 19–32)
CREATININE: 1.11 mg/dL (ref 0.40–1.50)
Calcium: 9.5 mg/dL (ref 8.4–10.5)
GFR: 77.94 mL/min (ref >60.00)
Glucose, Bld: 95 mg/dL (ref 70–99)
POTASSIUM: 4.1 meq/L (ref 3.5–5.1)
Sodium: 140 mEq/L (ref 135–145)

## 2017-01-04 LAB — LDL CHOLESTEROL, DIRECT: LDL DIRECT: 112 mg/dL

## 2017-01-04 LAB — HEPATIC FUNCTION PANEL
ALBUMIN: 4.6 g/dL (ref 3.5–5.2)
ALT: 39 U/L (ref 0–53)
AST: 25 U/L (ref 0–37)
Alkaline Phosphatase: 71 U/L (ref 39–117)
Bilirubin, Direct: 0.1 mg/dL (ref 0.0–0.3)
TOTAL PROTEIN: 7.4 g/dL (ref 6.0–8.3)
Total Bilirubin: 0.4 mg/dL (ref 0.2–1.2)

## 2017-01-04 LAB — LIPID PANEL
CHOLESTEROL: 196 mg/dL (ref 0–200)
HDL: 31 mg/dL — ABNORMAL LOW (ref >39.00)
NonHDL: 164.91
TRIGLYCERIDES: 277 mg/dL — AB (ref 0.0–149.0)
Total CHOL/HDL Ratio: 6
VLDL: 55.4 mg/dL — ABNORMAL HIGH (ref 0.0–40.0)

## 2017-01-09 ENCOUNTER — Ambulatory Visit (INDEPENDENT_AMBULATORY_CARE_PROVIDER_SITE_OTHER): Payer: Commercial Managed Care - PPO | Admitting: Family Medicine

## 2017-01-09 ENCOUNTER — Encounter: Payer: Self-pay | Admitting: Family Medicine

## 2017-01-09 VITALS — BP 100/72 | HR 65 | Temp 97.7°F | Ht 71.0 in | Wt 193.0 lb

## 2017-01-09 DIAGNOSIS — Z0000 Encounter for general adult medical examination without abnormal findings: Secondary | ICD-10-CM

## 2017-01-09 DIAGNOSIS — Z23 Encounter for immunization: Secondary | ICD-10-CM | POA: Diagnosis not present

## 2017-01-09 NOTE — Progress Notes (Signed)
Dr. Frederico Hamman T. Sharnee Douglass, MD, [facility] Primary [facility] and Sports Medicine [place], [address] Phone: [phone] Fax: [phone]  01/09/2017  Patient: Jay Hanner., MRN: [id number], DOB: Nov 02, 1976, 41 y.o.  Primary Physician:  Owens Loffler, MD   Chief Complaint  Patient presents with  . Annual Exam   Subjective:   Jay Matheson. is a 41 y.o. pleasant patient who presents with the following:  Preventative Health Maintenance Visit:  Health Maintenance Summary Reviewed and updated, unless pt declines services.  Tobacco History Reviewed. Alcohol: No concerns, no excessive use Exercise Habits: Some activity, rec at least 30 mins 5 times a week STD concerns: no risk or activity to increase risk Drug Use: None Encouraged self-testicular check  Girlfriend - dx with HPV.   Some neck pain down both sides of his neck.  An annoyance, down the sides.  [employer] in [employer], and in airport.   Some minor stomach cramps and with intercourse.  Something around his belly button.   Neck pain on the left and some pain in the shoulder.   70 hour work week.    Health Maintenance  Topic Date Due  . INFLUENZA VACCINE  02/16/2017 (Originally 06/19/2016)  . TETANUS/TDAP  01/09/2027  . HIV Screening  Completed   Immunization History  Administered Date(s) Administered  . Tdap 01/09/2017   Patient Active Problem List   Diagnosis Date Noted  . Murmur, cardiac 05/31/2011   Past Medical History:  Diagnosis Date  . Arm fracture    distant  . Heart murmur    Trace MR and TR on 2012 echo  . Seizures ([place])    Distantly, had about 5 seizures, usually associated with great fatigue and strain. Last 10 years ago. Tonic-clonic activity described. Post-ictal state.   No past surgical history on file. Social History   Social History  . Marital status: Married    Spouse name: N/A  . Number of children: N/A  . Years of education: N/A    Occupational History  . Not on file.   Social History Main Topics  . Smoking status: Never Smoker  . Smokeless tobacco: Never Used  . Alcohol use No  . Drug use: No  . Sexual activity: Not on file   Other Topics Concern  . Not on file   Social History Narrative  . No narrative on file   No family history on file. No Known Allergies  Medication list has been reviewed and updated.   General: Denies fever, chills, sweats. No significant weight loss. Eyes: Denies blurring,significant itching ENT: Denies earache, sore throat, and hoarseness. Cardiovascular: Denies chest pains, palpitations, dyspnea on exertion Respiratory: Denies cough, dyspnea at rest,wheeezing Breast: no concerns about lumps GI: Denies nausea, vomiting, diarrhea, constipation, change in bowel habits, abdominal pain, melena, hematochezia GU: Denies penile discharge, ED, urinary flow / outflow problems. No STD concerns. Musculoskeletal: as above Derm: Denies rash, itching Neuro: Denies  paresthesias, frequent falls, frequent headaches Psych: Denies depression, anxiety Endocrine: Denies cold intolerance, heat intolerance, polydipsia Heme: Denies enlarged lymph nodes Allergy: No hayfever  Objective:   BP 100/72   Pulse 65   Temp 97.7 F (36.5 C) (Oral)   Ht 5' 11"  (1.803 m)   Wt 193 lb (87.5 kg)   BMI 26.92 kg/m  Ideal Body Weight: Weight in (lb) to have BMI = 25: 178.9  No exam data present  GEN: well developed, well nourished, no acute distress Eyes:  conjunctiva and lids normal, PERRLA, EOMI ENT: TM clear, nares clear, oral exam WNL Neck: supple, no lymphadenopathy, no thyromegaly, no JVD Pulm: clear to auscultation and percussion, respiratory effort normal CV: regular rate and rhythm, S1-S2, no murmur, rub or gallop, no bruits, peripheral pulses normal and symmetric, no cyanosis, clubbing, edema or varicosities GI: soft, non-tender; no hepatosplenomegaly, masses; active bowel sounds all  quadrants GU: no hernia, testicular mass, penile discharge Lymph: no cervical, axillary or inguinal adenopathy MSK: gait normal, muscle tone and strength WNL, no joint swelling, effusions, discoloration, crepitus. Mild L sided neck pain - minimal loss of motion SKIN: clear, good turgor, color WNL, no rashes, lesions, or ulcerations Neuro: normal mental status, normal strength, sensation, and motion Psych: alert; oriented to person, place and time, normally interactive and not anxious or depressed in appearance. All labs reviewed with patient.  Lipids:    Component Value Date/Time   CHOL 196 01/04/2017 0812   TRIG 277.0 (H) 01/04/2017 0812   HDL 31.00 (L) 01/04/2017 0812   LDLDIRECT 112.0 01/04/2017 0812   VLDL 55.4 (H) 01/04/2017 0812   CHOLHDL 6 01/04/2017 0812   CBC: CBC Latest Ref Rng & Units 01/04/2017 06/27/2015 12/20/2014  WBC 4.0 - 10.5 K/uL 6.8 9.3 7.2  Hemoglobin 13.0 - 17.0 g/dL 15.0 15.4 14.5  Hematocrit 39.0 - 52.0 % 43.5 45.3 42.3  Platelets 150.0 - 400.0 K/uL 301.0 270.0 174.9    Basic Metabolic Panel:    Component Value Date/Time   NA 140 01/04/2017 0812   K 4.1 01/04/2017 0812   CL 105 01/04/2017 0812   CO2 29 01/04/2017 0812   BUN 13 01/04/2017 0812   CREATININE 1.11 01/04/2017 0812   GLUCOSE 95 01/04/2017 0812   CALCIUM 9.5 01/04/2017 0812   Hepatic Function Latest Ref Rng & Units 01/04/2017 06/27/2015 12/20/2014  Total Protein 6.0 - 8.3 g/dL 7.4 7.7 7.1  Albumin 3.5 - 5.2 g/dL 4.6 4.6 4.2  AST 0 - 37 U/L 25 24 25   ALT 0 - 53 U/L 39 30 38  Alk Phosphatase 39 - 117 U/L 71 68 72  Total Bilirubin 0.2 - 1.2 mg/dL 0.4 0.4 0.6  Bilirubin, Direct 0.0 - 0.3 mg/dL 0.1 0.1 0.1    No results found for: TSH No results found for: PSA  Assessment and Plan:   Healthcare maintenance  Need for prophylactic vaccination with combined diphtheria-tetanus-pertussis (DTP) vaccine - Plan: Tdap vaccine greater than or equal to 7yo IM  Health Maintenance Exam: The patient's  preventative maintenance and recommended screening tests for an annual wellness exam were reviewed in full today. Brought up to date unless services declined.  Counselled on the importance of diet, exercise, and its role in overall health and mortality. The patient's FH and SH was reviewed, including their home life, tobacco status, and drug and alcohol status.  Follow-up in 1 year for physical exam or additional follow-up below.  Follow-up: No Follow-up on file. Or follow-up in 1 year if not noted.  Orders Placed This Encounter  Procedures  . Tdap vaccine greater than or equal to 7yo IM    Signed,  Keishawn Rajewski T. Katoya Amato, MD   Allergies as of 01/09/2017   No Known Allergies     Medication List    as of 01/09/2017 11:59 PM   You have not been prescribed any medications.

## 2017-01-09 NOTE — Progress Notes (Signed)
Pre visit review using our clinic review tool, if applicable. No additional management support is needed unless otherwise documented below in the visit note. 

## 2017-12-16 ENCOUNTER — Other Ambulatory Visit: Payer: Self-pay

## 2017-12-16 ENCOUNTER — Encounter: Payer: Self-pay | Admitting: Family Medicine

## 2017-12-16 ENCOUNTER — Ambulatory Visit: Payer: Commercial Managed Care - PPO | Admitting: Family Medicine

## 2017-12-16 VITALS — BP 140/88 | HR 54 | Temp 97.8°F | Ht 71.0 in | Wt 196.8 lb

## 2017-12-16 DIAGNOSIS — R310 Gross hematuria: Secondary | ICD-10-CM

## 2017-12-16 DIAGNOSIS — R6882 Decreased libido: Secondary | ICD-10-CM

## 2017-12-16 LAB — POC URINALSYSI DIPSTICK (AUTOMATED)
Bilirubin, UA: NEGATIVE
Blood, UA: NEGATIVE
GLUCOSE UA: NEGATIVE
KETONES UA: NEGATIVE
LEUKOCYTES UA: NEGATIVE
Nitrite, UA: NEGATIVE
PROTEIN UA: NEGATIVE
Urobilinogen, UA: 0.2 E.U./dL
pH, UA: 6 (ref 5.0–8.0)

## 2017-12-16 NOTE — Patient Instructions (Signed)

## 2017-12-16 NOTE — Progress Notes (Signed)
Dr. Frederico Hamman T. Selene Peltzer, MD, [facility] Primary [facility] and Sports Medicine [place], [address] Phone: [phone] Fax: [phone]  12/16/2017  Patient: Jay Mcwhirter., MRN: [id number], DOB: [birth year], 42 y.o.  Primary Physician:  Owens Loffler, MD   Chief Complaint  Patient presents with  . Hematuria    started last Wednesday   Subjective:   Jay Marciano. is a 42 y.o. very pleasant male patient who presents with the following:  Blood in urine since the end of the stream - at the end of the stream. Macroscopic hematuria. No burning, no pain.  To his eyes it is very clear that he is having microscopic hematuria he has had this on all days except for today.  He does not have any pain, no pain in his back or flank, and he is not having any pain with urination or discharge.  No STD exposures, 1 girlfriend.   He is also working a lot, about 70 hours or so week, and has had some issues with decreased libido.  He wanted to know if this possibly could be from testosterone.  Past Medical History, Surgical History, Social History, Family History, Problem List, Medications, and Allergies have been reviewed and updated if relevant.  Patient Active Problem List   Diagnosis Date Noted  . Murmur, cardiac 05/31/2011    Past Medical History:  Diagnosis Date  . Arm fracture    distant  . Heart murmur    Trace MR and TR on 2012 echo  . Seizures ([place])    Distantly, had about 5 seizures, usually associated with great fatigue and strain. Last 10 years ago. Tonic-clonic activity described. Post-ictal state.    History reviewed. No pertinent surgical history.  Social History   Socioeconomic History  . Marital status: Divorced    Spouse name: Not on file  . Number of children: Not on file  . Years of education: Not on file  . Highest education level: Not on file  Social Needs  . Financial resource strain: Not on file  . Food insecurity - worry:  Not on file  . Food insecurity - inability: Not on file  . Transportation needs - medical: Not on file  . Transportation needs - non-medical: Not on file  Occupational History  . Not on file  Tobacco Use  . Smoking status: Never Smoker  . Smokeless tobacco: Never Used  Substance and Sexual Activity  . Alcohol use: No    Alcohol/week: 0.0 oz  . Drug use: No  . Sexual activity: Not on file  Other Topics Concern  . Not on file  Social History Narrative  . Not on file    History reviewed. No pertinent family history.  No Known Allergies  Medication list reviewed and updated in full in [place].   GEN: No acute illnesses, no fevers, chills. GI: No n/v/d, eating normally Pulm: No SOB Interactive and getting along well at home.  Otherwise, ROS is as per the HPI.  Objective:   BP 140/88   Pulse (!) 54   Temp 97.8 F (36.6 C) (Oral)   Ht 5\' 11"  (1.803 m)   Wt 196 lb 12 oz (89.2 kg)   BMI 27.44 kg/m   GEN: WDWN, NAD, Non-toxic, A & O x 3 HEENT: Atraumatic, Normocephalic. Neck supple. No masses, No LAD. Ears and Nose: No external deformity. CV: RRR, No M/G/R. No JVD. No thrill. No extra heart sounds. PULM:  CTA B, no wheezes, crackles, rhonchi. No retractions. No resp. distress. No accessory muscle use. EXTR: No c/c/e NEURO Normal gait.  PSYCH: Normally interactive. Conversant. Not depressed or anxious appearing.  Calm demeanor.   Laboratory and Imaging Data: Results for orders placed or performed in visit on 12/16/17  POCT Urinalysis Dipstick (Automated)  Result Value Ref Range   Color, UA dk yellow    Clarity, UA clear    Glucose, UA negative    Bilirubin, UA negative    Ketones, UA negative    Spec Grav, UA >=1.030 (A) 1.010 - 1.025   Blood, UA negative    pH, UA 6.0 5.0 - 8.0   Protein, UA negative    Urobilinogen, UA 0.2 0.2 or 1.0 E.U./dL   Nitrite, UA negative    Leukocytes, UA Negative Negative     Assessment and Plan:   Gross hematuria -  Plan: POCT Urinalysis Dipstick (Automated), Ambulatory referral to Urology  Decreased libido - Plan: Testos,Total,Free and SHBG (Male)   I talk with him about hematuria and gross hematuria, and despite a normal UA today given that he has had multiple episodes of gross hematuria, this warrants some further evaluation by urology.  Appreciate their help.  With some decreased libido, I think is very reasonable to check a testosterone level, a.m. from 8 - 10 in the morning.  He is going to come in for some pre-physical blood work in a few weeks and will do it then.  Follow-up: No Follow-up on file.  Orders Placed This Encounter  Procedures  . Ambulatory referral to Urology  . POCT Urinalysis Dipstick (Automated)    Signed,  Harbor Paster T. Jhon Mallozzi, MD   Allergies as of 12/16/2017   No Known Allergies     Medication List    as of 12/16/2017 11:59 PM   You have not been prescribed any medications.

## 2017-12-24 DIAGNOSIS — M5412 Radiculopathy, cervical region: Secondary | ICD-10-CM | POA: Diagnosis not present

## 2017-12-24 DIAGNOSIS — M9901 Segmental and somatic dysfunction of cervical region: Secondary | ICD-10-CM | POA: Diagnosis not present

## 2017-12-24 DIAGNOSIS — M9907 Segmental and somatic dysfunction of upper extremity: Secondary | ICD-10-CM | POA: Diagnosis not present

## 2017-12-25 DIAGNOSIS — M9901 Segmental and somatic dysfunction of cervical region: Secondary | ICD-10-CM | POA: Diagnosis not present

## 2017-12-25 DIAGNOSIS — M9907 Segmental and somatic dysfunction of upper extremity: Secondary | ICD-10-CM | POA: Diagnosis not present

## 2017-12-25 DIAGNOSIS — M5412 Radiculopathy, cervical region: Secondary | ICD-10-CM | POA: Diagnosis not present

## 2018-01-02 ENCOUNTER — Ambulatory Visit: Payer: Commercial Managed Care - PPO | Admitting: Urology

## 2018-01-02 ENCOUNTER — Encounter: Payer: Self-pay | Admitting: Urology

## 2018-01-02 VITALS — BP 134/83 | HR 64 | Temp 98.0°F | Ht 71.0 in | Wt 193.0 lb

## 2018-01-02 DIAGNOSIS — R310 Gross hematuria: Secondary | ICD-10-CM

## 2018-01-02 LAB — URINALYSIS, COMPLETE
Bilirubin, UA: NEGATIVE
GLUCOSE, UA: NEGATIVE
Leukocytes, UA: NEGATIVE
NITRITE UA: NEGATIVE
Protein, UA: NEGATIVE
RBC, UA: NEGATIVE
Specific Gravity, UA: 1.025 (ref 1.005–1.030)
UUROB: 0.2 mg/dL (ref 0.2–1.0)
pH, UA: 6 (ref 5.0–7.5)

## 2018-01-02 NOTE — Progress Notes (Signed)
01/02/2018 11:47 AM   [name]. 1976-07-16 [id number]  Referring provider: Owens Loffler, MD [address] [place], [place] [address]  Chief Complaint  Patient presents with  . Hematuria    HPI: Jay Price is a 42 year old male seen in consultation at the request of Dr. Lorelei Pont for evaluation of gross hematuria.  He had onset of total gross painless hematuria on 12/11/2016.  This lasted for 2 days and then for the next 3 days he noted blood at the end of his urine stream.  He saw Dr. Lorelei Pont on 12/16/2017 and a dipstick urinalysis was negative.  He denied flank, abdominal, pelvic or scrotal pain.  He had no frequency, urgency or dysuria.  He denies prior history of urologic evaluation or previous episodes of gross hematuria.  He did have a renal ultrasound performed in 2014 which showed normal kidneys bilaterally.   PMH: Past Medical History:  Diagnosis Date  . Arm fracture    distant  . Heart murmur    Trace MR and TR on 2012 echo  . Seizures ([place])    Distantly, had about 5 seizures, usually associated with great fatigue and strain. Last 10 years ago. Tonic-clonic activity described. Post-ictal state.    Surgical History: History reviewed. No pertinent surgical history.  Home Medications:  Allergies as of 01/02/2018   No Known Allergies     Medication List    as of 01/02/2018 11:47 AM   You have not been prescribed any medications.     Allergies: No Known Allergies  Family History: Family History  Problem Relation Age of Onset  . Prostate cancer Neg Hx   . Bladder Cancer Neg Hx   . Kidney Stones Neg Hx     Social History:  reports that  has never smoked. he has never used smokeless tobacco. He reports that he drinks alcohol. He reports that he does not use drugs.  ROS: UROLOGY Frequent Urination?: Yes Hard to postpone urination?: Yes Burning/pain with urination?: No Get up at night to urinate?: Yes Leakage of urine?: No Urine stream  starts and stops?: Yes Trouble starting stream?: No Do you have to strain to urinate?: No Blood in urine?: Yes Urinary tract infection?: No Sexually transmitted disease?: No Injury to kidneys or bladder?: No Painful intercourse?: No Weak stream?: No Erection problems?: Yes Penile pain?: No  Gastrointestinal Nausea?: No Vomiting?: No Indigestion/heartburn?: No Diarrhea?: No Constipation?: No  Constitutional Fever: No Night sweats?: No Weight loss?: No Fatigue?: No  Skin Skin rash/lesions?: No Itching?: No  Eyes Blurred vision?: No Double vision?: No  Ears/Nose/Throat Sore throat?: No Sinus problems?: No  Hematologic/Lymphatic Swollen glands?: Yes Easy bruising?: No  Cardiovascular Leg swelling?: No Chest pain?: No  Respiratory Cough?: No Shortness of breath?: No  Endocrine Excessive thirst?: No  Musculoskeletal Back pain?: Yes Joint pain?: Yes  Neurological Headaches?: No Dizziness?: No  Psychologic Depression?: No Anxiety?: No  Physical Exam: BP 134/83 (BP Location: Left Arm, Patient Position: Sitting, Cuff Size: Normal)   Pulse 64   Temp 98 F (36.7 C) (Oral)   Ht 5\' 11"  (1.803 m)   Wt 193 lb (87.5 kg)   SpO2 99%   BMI 26.92 kg/m   Constitutional:  Alert and oriented, No acute distress. HEENT: [place] AT, moist mucus membranes.  Trachea midline, no masses. Cardiovascular: No clubbing, cyanosis, or edema. Respiratory: Normal respiratory effort, no increased work of breathing. GI: Abdomen is soft, nontender, nondistended, no abdominal masses GU: No CVA  tenderness.  Penis circumcised without lesions, testes descended bilaterally without masses or tenderness, cord/epididymes palpably normal. Skin: No rashes, bruises or suspicious lesions. Lymph: No cervical or inguinal adenopathy. Neurologic: Grossly intact, no focal deficits, moving all 4 extremities. Psychiatric: Normal mood and affect.  Laboratory Data: Lab Results  Component Value  Date   WBC 6.8 01/04/2017   HGB 15.0 01/04/2017   HCT 43.5 01/04/2017   MCV 88.5 01/04/2017   PLT 301.0 01/04/2017    Lab Results  Component Value Date   CREATININE 1.11 01/04/2017    Urinalysis Dipstick/microscopy negative   Assessment & Plan:   42 year old male with recent episode of total gross painless hematuria.  We discussed potential causes of hematuria both benign and malignant.  I recommended proceeding with a full hematuria evaluation to include CT urogram and cystoscopy.  He is in agreement and desires to proceed.  After his hematuria evaluation is complete he is interested in pursuing vasectomy.   1. Gross hematuria  - Urinalysis, Complete - CT HEMATURIA WORKUP; Future    Abbie Sons, MD  [place] [address], [place], [place] [address] [phone]

## 2018-01-14 ENCOUNTER — Telehealth: Payer: Self-pay | Admitting: Family Medicine

## 2018-01-14 DIAGNOSIS — Z113 Encounter for screening for infections with a predominantly sexual mode of transmission: Secondary | ICD-10-CM

## 2018-01-14 NOTE — Telephone Encounter (Signed)
Copied from [place] [phone]. Topic: General - Other >> Jan 14, 2018  2:25 PM Neva Seat wrote: Pt is asking if he can have a STD test along with his lab work tomorrow morning?  If possible to add it to the lab orders.

## 2018-01-14 NOTE — Telephone Encounter (Signed)
Copied from [place] [id number]. Topic: General - Other >> Jan 14, 2018  2:25 PM Neva Seat wrote: Pt is asking if he can have a STD test along with his lab work tomorrow morning?  If possible to add it to the lab orders. >> Jan 14, 2018  2:39 PM Helene Shoe, LPN wrote: Pt has lab appt scheduled on 01/15/18 at 8 AM for CPX labs; pt has CPX scheduled with Dr Lorelei Pont on 01/20/18.Please advise.

## 2018-01-14 NOTE — Telephone Encounter (Signed)
Labs ordered.

## 2018-01-14 NOTE — Telephone Encounter (Signed)
Pt has lab appt on 01/15/18 at 8 AM and scheduled for CPX on 01/20/18 with Dr Lorelei Pont.Please advise.

## 2018-01-15 ENCOUNTER — Other Ambulatory Visit (INDEPENDENT_AMBULATORY_CARE_PROVIDER_SITE_OTHER): Payer: Commercial Managed Care - PPO

## 2018-01-15 DIAGNOSIS — R6882 Decreased libido: Secondary | ICD-10-CM

## 2018-01-15 DIAGNOSIS — Z0000 Encounter for general adult medical examination without abnormal findings: Secondary | ICD-10-CM | POA: Diagnosis not present

## 2018-01-15 DIAGNOSIS — Z113 Encounter for screening for infections with a predominantly sexual mode of transmission: Secondary | ICD-10-CM

## 2018-01-15 LAB — CBC WITH DIFFERENTIAL/PLATELET
BASOS PCT: 0.6 % (ref 0.0–3.0)
Basophils Absolute: 0 10*3/uL (ref 0.0–0.1)
EOS ABS: 0.2 10*3/uL (ref 0.0–0.7)
EOS PCT: 2.5 % (ref 0.0–5.0)
HEMATOCRIT: 43.8 % (ref 39.0–52.0)
Hemoglobin: 15 g/dL (ref 13.0–17.0)
LYMPHS PCT: 33.8 % (ref 12.0–46.0)
Lymphs Abs: 2.2 10*3/uL (ref 0.7–4.0)
MCHC: 34.3 g/dL (ref 30.0–36.0)
MCV: 89 fl (ref 78.0–100.0)
Monocytes Absolute: 0.7 10*3/uL (ref 0.1–1.0)
Monocytes Relative: 10.2 % (ref 3.0–12.0)
NEUTROS ABS: 3.4 10*3/uL (ref 1.4–7.7)
Neutrophils Relative %: 52.9 % (ref 43.0–77.0)
PLATELETS: 297 10*3/uL (ref 150.0–400.0)
RBC: 4.92 Mil/uL (ref 4.22–5.81)
RDW: 12.5 % (ref 11.5–15.5)
WBC: 6.4 10*3/uL (ref 4.0–10.5)

## 2018-01-15 LAB — COMPREHENSIVE METABOLIC PANEL
ALT: 43 U/L (ref 0–53)
AST: 24 U/L (ref 0–37)
Albumin: 4.2 g/dL (ref 3.5–5.2)
Alkaline Phosphatase: 68 U/L (ref 39–117)
BUN: 17 mg/dL (ref 6–23)
CALCIUM: 9.8 mg/dL (ref 8.4–10.5)
CHLORIDE: 103 meq/L (ref 96–112)
CO2: 30 meq/L (ref 19–32)
CREATININE: 1.09 mg/dL (ref 0.40–1.50)
GFR: 79.19 mL/min (ref >60.00)
Glucose, Bld: 109 mg/dL — ABNORMAL HIGH (ref 70–99)
POTASSIUM: 4.7 meq/L (ref 3.5–5.1)
Sodium: 140 mEq/L (ref 135–145)
Total Bilirubin: 0.5 mg/dL (ref 0.2–1.2)
Total Protein: 7.2 g/dL (ref 6.0–8.3)

## 2018-01-15 LAB — LIPID PANEL
CHOLESTEROL: 188 mg/dL (ref 0–200)
HDL: 34.9 mg/dL — ABNORMAL LOW (ref >39.00)
LDL CALC: 133 mg/dL — AB (ref 0–99)
NonHDL: 152.76
TRIGLYCERIDES: 98 mg/dL (ref 0.0–149.0)
Total CHOL/HDL Ratio: 5
VLDL: 19.6 mg/dL (ref 0.0–40.0)

## 2018-01-16 LAB — HSV(HERPES SIMPLEX VRS) I + II AB-IGG
HAV 1 IGG,TYPE SPECIFIC AB: 1.87 index — ABNORMAL HIGH
HSV 2 IGG,TYPE SPECIFIC AB: <0.9 index

## 2018-01-16 LAB — HEPATITIS PANEL, ACUTE
HEP A IGM: NONREACTIVE
HEP B C IGM: NONREACTIVE
Hepatitis B Surface Ag: NONREACTIVE
Hepatitis C Ab: NONREACTIVE
SIGNAL TO CUT-OFF: 0.03 (ref <1.00)

## 2018-01-16 LAB — C. TRACHOMATIS/N. GONORRHOEAE RNA
C. trachomatis RNA, TMA: NOT DETECTED
N. GONORRHOEAE RNA, TMA: NOT DETECTED

## 2018-01-16 LAB — RPR: RPR: NONREACTIVE

## 2018-01-16 LAB — HIV ANTIBODY (ROUTINE TESTING W REFLEX): HIV 1&2 Ab, 4th Generation: NONREACTIVE

## 2018-01-19 LAB — TESTOS,TOTAL,FREE AND SHBG (FEMALE)
Free Testosterone: 90.2 pg/mL (ref 35.0–155.0)
Sex Hormone Binding: 18 nmol/L (ref 10–50)
Testosterone, Total, LC-MS-MS: 389 ng/dL (ref 250–1100)

## 2018-01-19 LAB — HSV 1/2 AB (IGM), IFA W/RFLX TITER
HSV 1 IgM Screen: NEGATIVE
HSV 2 IgM Screen: NEGATIVE

## 2018-01-20 ENCOUNTER — Encounter: Payer: Self-pay | Admitting: Family Medicine

## 2018-01-20 ENCOUNTER — Ambulatory Visit (INDEPENDENT_AMBULATORY_CARE_PROVIDER_SITE_OTHER): Payer: Commercial Managed Care - PPO | Admitting: Family Medicine

## 2018-01-20 ENCOUNTER — Other Ambulatory Visit: Payer: Self-pay

## 2018-01-20 VITALS — BP 106/80 | HR 72 | Temp 97.4°F | Ht 71.5 in | Wt 191.5 lb

## 2018-01-20 DIAGNOSIS — Z0000 Encounter for general adult medical examination without abnormal findings: Secondary | ICD-10-CM

## 2018-01-20 NOTE — Progress Notes (Signed)
Dr. Frederico Hamman T. Tian Davison, MD, [facility] [place], [address] Phone: [phone] Fax: [phone]  01/20/2018  Patient: Jay Price., MRN: [id number], DOB: Jan 14, 1976, 42 y.o.  Primary Physician:  Owens Loffler, MD   Chief Complaint  Patient presents with  . Annual Exam   Subjective:   Jay Price. is a 42 y.o. pleasant patient who presents with the following:  Preventative Health Maintenance Visit:  Health Maintenance Summary Reviewed and updated, unless pt declines services.  Tobacco History Reviewed. Alcohol: No concerns, no excessive use Exercise Habits: Some activity, rec at least 30 mins 5 times a week STD concerns: no risk or activity to increase risk Drug Use: None Encouraged self-testicular check  Health Maintenance  Topic Date Due  . INFLUENZA VACCINE  02/16/2018 (Originally 06/19/2017)  . TETANUS/TDAP  01/09/2027  . HIV Screening  Completed   Immunization History  Administered Date(s) Administered  . Tdap 01/09/2017   Patient Active Problem List   Diagnosis Date Noted  . Murmur, cardiac 05/31/2011   Past Medical History:  Diagnosis Date  . Arm fracture    distant  . Heart murmur    Trace MR and TR on 2012 echo  . Seizures ([place])    Distantly, had about 5 seizures, usually associated with great fatigue and strain. Last 10 years ago. Tonic-clonic activity described. Post-ictal state.   History reviewed. No pertinent surgical history. Social History   Socioeconomic History  . Marital status: Divorced    Spouse name: Not on file  . Number of children: Not on file  . Years of education: Not on file  . Highest education level: Not on file  Social Needs  . Financial resource strain: Not on file  . Food insecurity - worry: Not on file  . Food insecurity - inability: Not on file  . Transportation needs - medical: Not on file  . Transportation needs - non-medical: Not on file    Occupational History  . Not on file  Tobacco Use  . Smoking status: Never Smoker  . Smokeless tobacco: Never Used  Substance and Sexual Activity  . Alcohol use: Yes    Alcohol/week: 0.0 oz    Comment: Socially   . Drug use: No  . Sexual activity: Yes    Birth control/protection: None  Other Topics Concern  . Not on file  Social History Narrative  . Not on file   Family History  Problem Relation Age of Onset  . Prostate cancer Neg Hx   . Bladder Cancer Neg Hx   . Kidney Stones Neg Hx    No Known Allergies  Medication list has been reviewed and updated.   General: Denies fever, chills, sweats. No significant weight loss. Eyes: Denies blurring,significant itching ENT: Denies earache, sore throat, and hoarseness. Cardiovascular: Denies chest pains, palpitations, dyspnea on exertion Respiratory: Denies cough, dyspnea at rest,wheeezing Breast: no concerns about lumps GI: Denies nausea, vomiting, diarrhea, constipation, change in bowel habits, abdominal pain, melena, hematochezia GU: Denies penile discharge, ED, urinary flow / outflow problems. No STD concerns. Musculoskeletal: Denies back pain, joint pain Derm: Denies rash, itching Neuro: Denies  paresthesias, frequent falls, frequent headaches Psych: Denies depression, anxiety Endocrine: Denies cold intolerance, heat intolerance, polydipsia Heme: Denies enlarged lymph nodes Allergy: No hayfever  Objective:   BP 106/80   Pulse 72   Temp (!) 97.4 F (36.3 C) (Oral)   Ht 5' 11.5" (1.816 m)  Wt 191 lb 8 oz (86.9 kg)   BMI 26.34 kg/m  Ideal Body Weight: Weight in (lb) to have BMI = 25: 181.4  No exam data present  GEN: well developed, well nourished, no acute distress Eyes: conjunctiva and lids normal, PERRLA, EOMI ENT: TM clear, nares clear, oral exam WNL Neck: supple, no lymphadenopathy, no thyromegaly, no JVD Pulm: clear to auscultation and percussion, respiratory effort normal CV: regular rate and rhythm,  S1-S2, no murmur, rub or gallop, no bruits, peripheral pulses normal and symmetric, no cyanosis, clubbing, edema or varicosities GI: soft, non-tender; no hepatosplenomegaly, masses; active bowel sounds all quadrants GU: no hernia, testicular mass, penile discharge Lymph: no cervical, axillary or inguinal adenopathy MSK: gait normal, muscle tone and strength WNL, no joint swelling, effusions, discoloration, crepitus   Shoulder: R Inspection: No muscle wasting or winging Ecchymosis/edema: neg  AC joint, scapula, clavicle: NT Cervical spine: NT, 35% loss of motion in the lateral movements.  Spurling's: neg Abduction: full, 5/5 Flexion: full, 5/5 IR, full, lift-off: 5/5 ER at neutral: full, 5/5 AC crossover and compression: neg Neer: neg Hawkins: neg Drop Test: neg Empty Can: neg Supraspinatus insertion: NT Bicipital groove: NT Speed's: neg Yergason's: neg Sulcus sign: neg Scapular dyskinesis: none C5-T1 intact Sensation intact Grip 5/5   SKIN: clear, good turgor, color WNL, no rashes, lesions, or ulcerations Neuro: normal mental status, normal strength, sensation, and motion Psych: alert; oriented to person, place and time, normally interactive and not anxious or depressed in appearance. All labs reviewed with patient.  Results for orders placed or performed in visit on 01/15/18  C. trachomatis/N. gonorrhoeae RNA  Result Value Ref Range   C. trachomatis RNA, TMA NOT DETECTED NOT DETECT   N. gonorrhoeae RNA, TMA NOT DETECTED NOT DETECT  HSV 1/2 Ab (IgM), IFA w/rflx Titer  Result Value Ref Range   HSV 1 IgM Screen Negative Negative   HSV 2 IgM Screen Negative Negative  Lipid panel  Result Value Ref Range   Cholesterol 188 0 - 200 mg/dL   Triglycerides 98.0 0.0 - 149.0 mg/dL   HDL 34.90 (L) >39.00 mg/dL   VLDL 19.6 0.0 - 40.0 mg/dL   LDL Cholesterol 133 (H) 0 - 99 mg/dL   Total CHOL/HDL Ratio 5    NonHDL 152.76   CBC with Differential/Platelet  Result Value Ref Range     WBC 6.4 4.0 - 10.5 K/uL   RBC 4.92 4.22 - 5.81 Mil/uL   Hemoglobin 15.0 13.0 - 17.0 g/dL   HCT 43.8 39.0 - 52.0 %   MCV 89.0 78.0 - 100.0 fl   MCHC 34.3 30.0 - 36.0 g/dL   RDW 12.5 11.5 - 15.5 %   Platelets 297.0 150.0 - 400.0 K/uL   Neutrophils Relative % 52.9 43.0 - 77.0 %   Lymphocytes Relative 33.8 12.0 - 46.0 %   Monocytes Relative 10.2 3.0 - 12.0 %   Eosinophils Relative 2.5 0.0 - 5.0 %   Basophils Relative 0.6 0.0 - 3.0 %   Neutro Abs 3.4 1.4 - 7.7 K/uL   Lymphs Abs 2.2 0.7 - 4.0 K/uL   Monocytes Absolute 0.7 0.1 - 1.0 K/uL   Eosinophils Absolute 0.2 0.0 - 0.7 K/uL   Basophils Absolute 0.0 0.0 - 0.1 K/uL  Comprehensive metabolic panel  Result Value Ref Range   Sodium 140 135 - 145 mEq/L   Potassium 4.7 3.5 - 5.1 mEq/L   Chloride 103 96 - 112 mEq/L   CO2 30 19 - 32  mEq/L   Glucose, Bld 109 (H) 70 - 99 mg/dL   BUN 17 6 - 23 mg/dL   Creatinine, Ser 1.09 0.40 - 1.50 mg/dL   Total Bilirubin 0.5 0.2 - 1.2 mg/dL   Alkaline Phosphatase 68 39 - 117 U/L   AST 24 0 - 37 U/L   ALT 43 0 - 53 U/L   Total Protein 7.2 6.0 - 8.3 g/dL   Albumin 4.2 3.5 - 5.2 g/dL   Calcium 9.8 8.4 - 10.5 mg/dL   GFR 79.19 >60.00 mL/min  HSV(herpes simplex vrs) 1+2 ab-IgG  Result Value Ref Range   HAV 1 IGG,TYPE SPECIFIC AB 1.87 (H) index   HSV 2 IGG,TYPE SPECIFIC AB <0.90 index  RPR  Result Value Ref Range   RPR Ser Ql NON-REACTIVE NON-REACTI  Hepatitis panel, acute  Result Value Ref Range   Hep A IgM NON-REACTIVE NON-REACTI   Hepatitis B Surface Ag NON-REACTIVE NON-REACTI   Hep B C IgM NON-REACTIVE NON-REACTI   Hepatitis C Ab NON-REACTIVE NON-REACTI   SIGNAL TO CUT-OFF 0.03 <1.00  HIV antibody  Result Value Ref Range   HIV 1&2 Ab, 4th Generation NON-REACTIVE NON-REACTI  Testos,Total,Free and SHBG (Male)  Result Value Ref Range   Testosterone, Total, LC-MS-MS 389 250 - 1,100 ng/dL   Free Testosterone 90.2 35.0 - 155.0 pg/mL   Sex Hormone Binding 18 10 - 50 nmol/L     Assessment  and Plan:   Healthcare maintenance  Reviewed basic weightlifting. Work on neck ROM.  Health Maintenance Exam: The patient's preventative maintenance and recommended screening tests for an annual wellness exam were reviewed in full today. Brought up to date unless services declined.  Counselled on the importance of diet, exercise, and its role in overall health and mortality. The patient's FH and SH was reviewed, including their home life, tobacco status, and drug and alcohol status.  Follow-up in 1 year for physical exam or additional follow-up below.  Follow-up: No Follow-up on file. Or follow-up in 1 year if not noted.  Signed,  Maud Deed. Malcolm Hetz, MD   Allergies as of 01/20/2018   No Known Allergies     Medication List    as of 01/20/2018  9:05 AM   You have not been prescribed any medications.

## 2018-01-23 ENCOUNTER — Other Ambulatory Visit: Payer: Commercial Managed Care - PPO | Admitting: Urology

## 2018-01-31 ENCOUNTER — Ambulatory Visit
Admission: RE | Admit: 2018-01-31 | Discharge: 2018-01-31 | Disposition: A | Discharge status: Home / Self Care | Payer: Commercial Managed Care - PPO | Source: Ambulatory Visit | Attending: Urology | Admitting: Urology

## 2018-01-31 DIAGNOSIS — R310 Gross hematuria: Secondary | ICD-10-CM | POA: Insufficient documentation

## 2018-01-31 DIAGNOSIS — N200 Calculus of kidney: Secondary | ICD-10-CM | POA: Insufficient documentation

## 2018-01-31 MED ORDER — IOPAMIDOL (ISOVUE-300) INJECTION 61%
150.0000 mL | Freq: Once | INTRAVENOUS | Status: AC | PRN
  Administered 2018-01-31: 150 mL via INTRAVENOUS

## 2018-02-04 ENCOUNTER — Ambulatory Visit: Payer: Commercial Managed Care - PPO | Admitting: Urology

## 2018-02-04 ENCOUNTER — Encounter: Payer: Self-pay | Admitting: Urology

## 2018-02-04 VITALS — BP 145/78 | HR 79 | Ht 71.5 in | Wt 193.7 lb

## 2018-02-04 DIAGNOSIS — N200 Calculus of kidney: Secondary | ICD-10-CM

## 2018-02-04 DIAGNOSIS — R310 Gross hematuria: Secondary | ICD-10-CM

## 2018-02-04 LAB — URINALYSIS, COMPLETE
BILIRUBIN UA: NEGATIVE
Glucose, UA: NEGATIVE
Ketones, UA: NEGATIVE
Leukocytes, UA: NEGATIVE
Nitrite, UA: NEGATIVE
PROTEIN UA: NEGATIVE
RBC, UA: NEGATIVE
Specific Gravity, UA: 1.015 (ref 1.005–1.030)
Urobilinogen, Ur: 0.2 mg/dL (ref 0.2–1.0)
pH, UA: 7 (ref 5.0–7.5)

## 2018-02-04 MED ORDER — CIPROFLOXACIN HCL 500 MG PO TABS
500.0000 mg | ORAL_TABLET | Freq: Once | ORAL | Status: AC
  Administered 2018-02-04: 500 mg via ORAL

## 2018-02-04 MED ORDER — LIDOCAINE HCL 2 % EX GEL
1.0000 "application " | Freq: Once | CUTANEOUS | Status: AC
  Administered 2018-02-04: 1 via URETHRAL

## 2018-02-04 NOTE — Progress Notes (Signed)
   02/04/18  CC:  Chief Complaint  Patient presents with  . Cysto    HPI: 42 year old male seen 01/02/2018 for an episode of total gross painless hematuria.  CT urogram remarkable for a nonobstructing 4 mm right renal calculus.  No renal mass or hydronephrosis.  No ureteral or bladder abnormalities identified.  Blood pressure (!) 145/78, pulse 79, height 5' 11.5" (1.816 m), weight 193 lb 11.2 oz (87.9 kg). NED. A&Ox3.     Cystoscopy Procedure Note  Patient identification was confirmed, informed consent was obtained, and patient was prepped using Betadine solution.  Lidocaine jelly was administered per urethral meatus.    Preoperative abx where received prior to procedure.     Pre-Procedure: - Inspection reveals a normal caliber ureteral meatus.  Procedure: The flexible cystoscope was introduced without difficulty - No urethral strictures/lesions are present. - Mild lateral lobe enlargement prostate; hypervascularity with mild inflammatory change - Mild bladder neck elevation - Bilateral ureteral orifices identified - Bladder mucosa  reveals no ulcers, tumors, or lesions - No bladder stones - No trabeculation  Retroflexion shows hypervascularity at bladder neck   Post-Procedure: - Patient tolerated the procedure well  Assessment/ Plan: No significant abnormalities on cystoscopy or CT urogram.  The nonobstructing right renal calculus would not be a cause of gross hematuria.  Based on today's cystoscopy the most likely source would be prostatic in origin.  He was instructed to call for recurrent gross hematuria.  Recommended a follow-up visit with KUB in 6 months.

## 2018-02-05 DIAGNOSIS — N200 Calculus of kidney: Secondary | ICD-10-CM | POA: Insufficient documentation

## 2018-04-17 IMAGING — CT CT ABD-PEL WO/W CM
2 of 8 series · 13 of 46 positions shown, 19 images · IV contrast (iopamidol)
Comparison: Renal ultrasound dated 05/11/2013

CLINICAL DATA: Hematuria 6 weeks ago lasting 5 days.

EXAM:
CT ABDOMEN AND PELVIS WITHOUT AND WITH CONTRAST
TECHNIQUE: Multidetector CT imaging of the abdomen and pelvis was performed
following the standard protocol before and following the bolus
administration of intravenous contrast.
CONTRAST:  150mL AXZGHT-M00 IOPAMIDOL (AXZGHT-M00) INJECTION 61%

[Series 5: coronal pre · coronal · non-contrast · 0.85mm/px · 3 of 93 slices shown]
[im 24/93  soft-tissue]
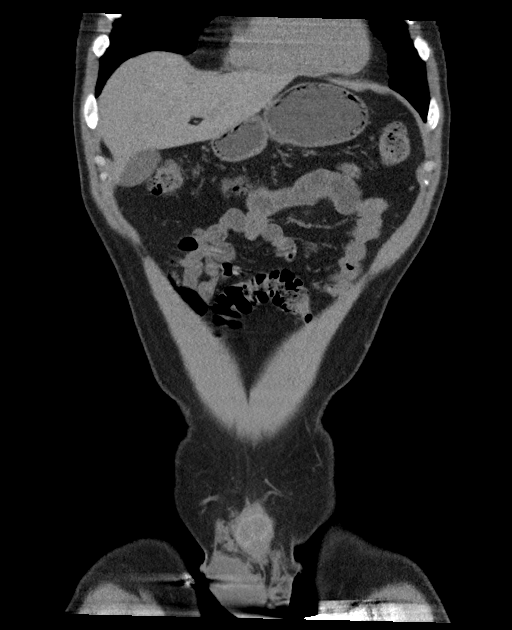
[im 47/93  soft-tissue]
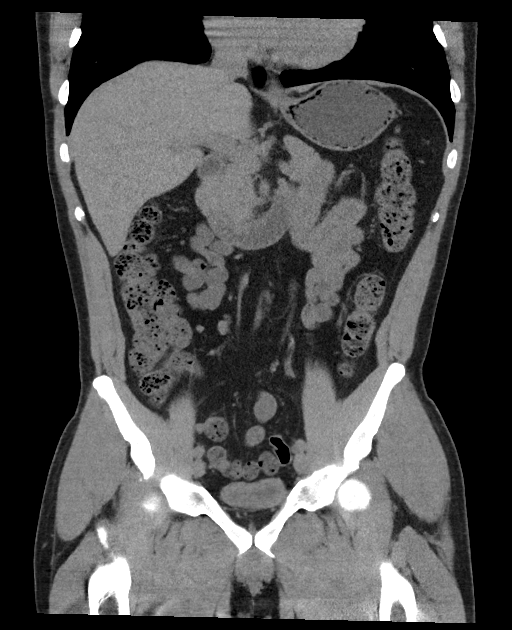
[im 70/93  soft-tissue]
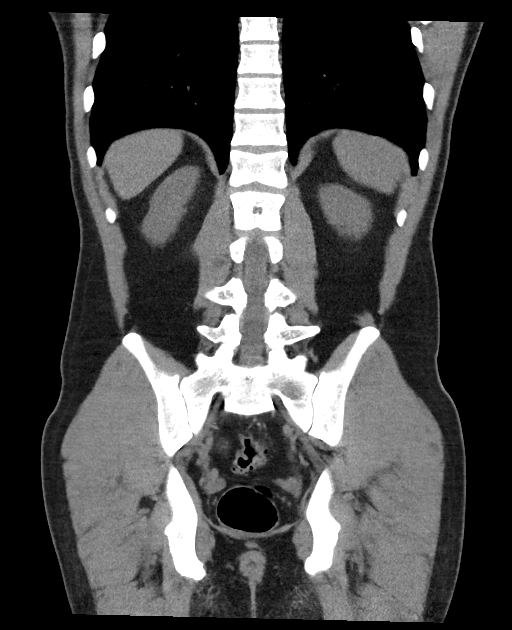

[Series 8: axial delay · axial · delayed · 0.79mm/px · z∈[-581,-116]mm · 10 of 113 slices shown, 16 images]
[im 10/113  soft-tissue]
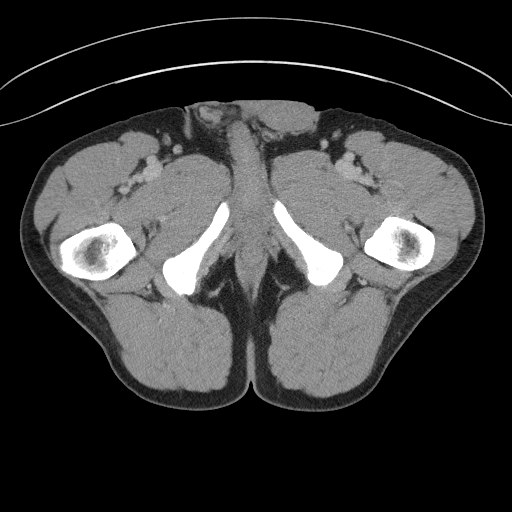
[im 10/113  bone]
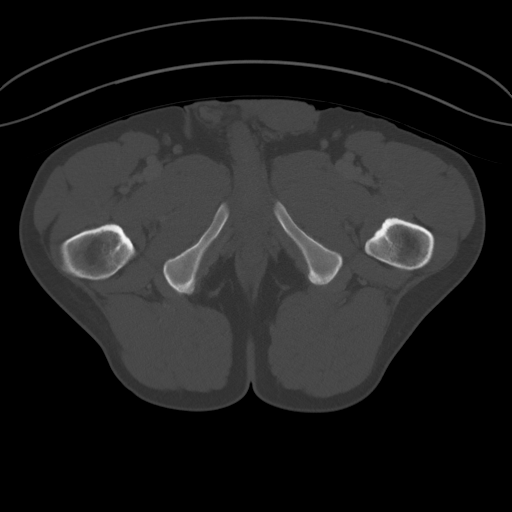
[im 19/113  soft-tissue]
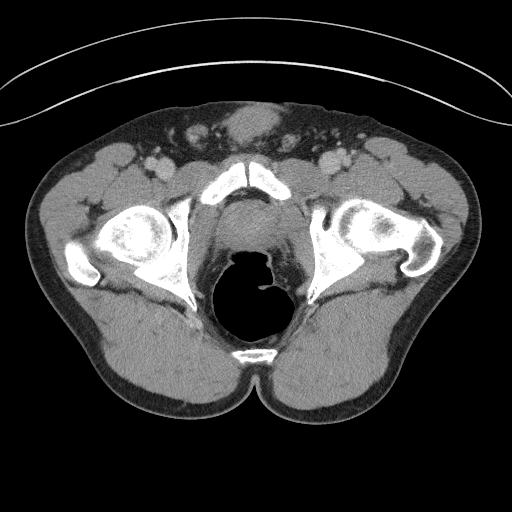
[im 29/113  soft-tissue]
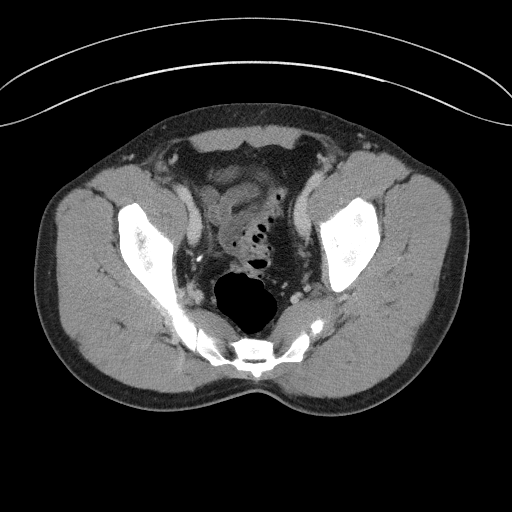
[im 38/113  soft-tissue]
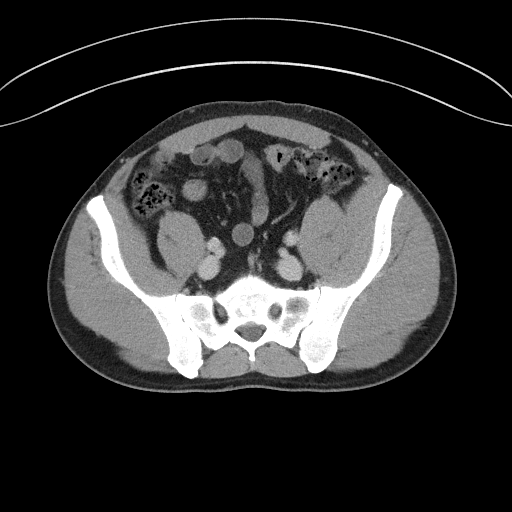
[im 47/113  soft-tissue]
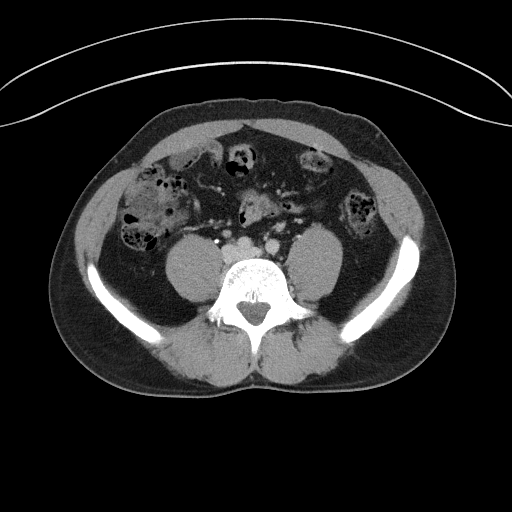
[im 66/113  soft-tissue]
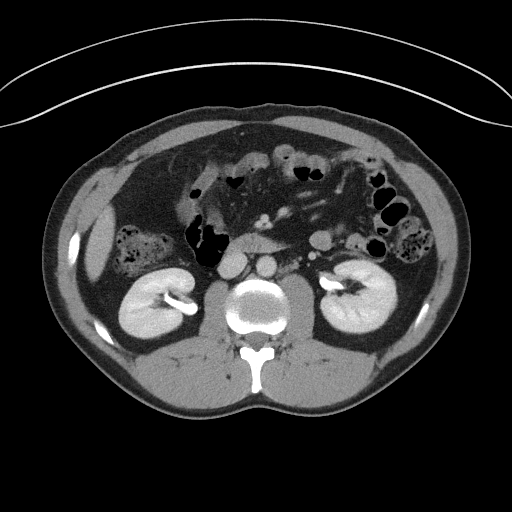
[im 75/113  soft-tissue]
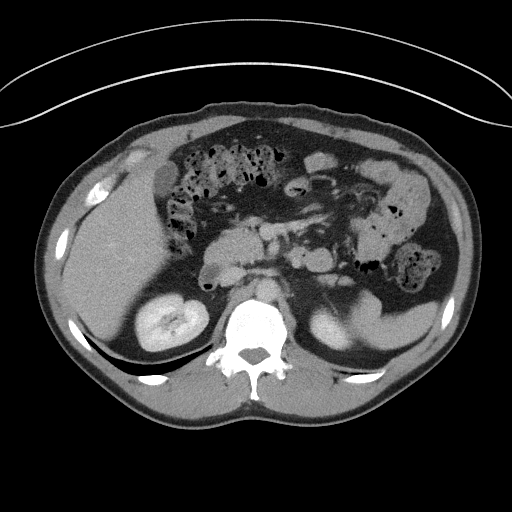
[im 75/113  lung]
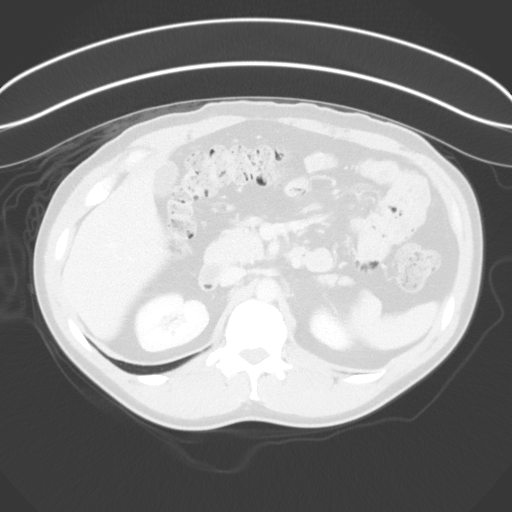
[im 85/113  soft-tissue]
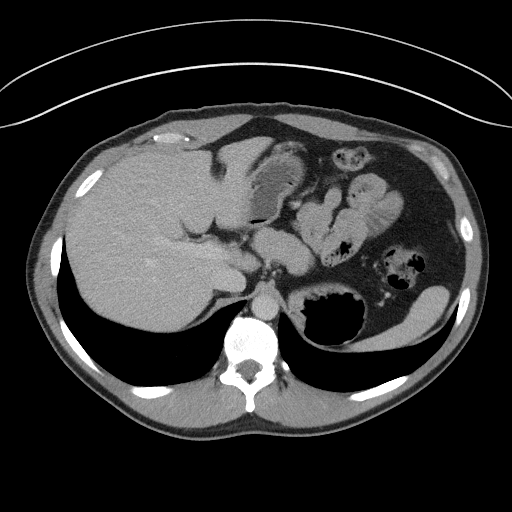
[im 85/113  lung]
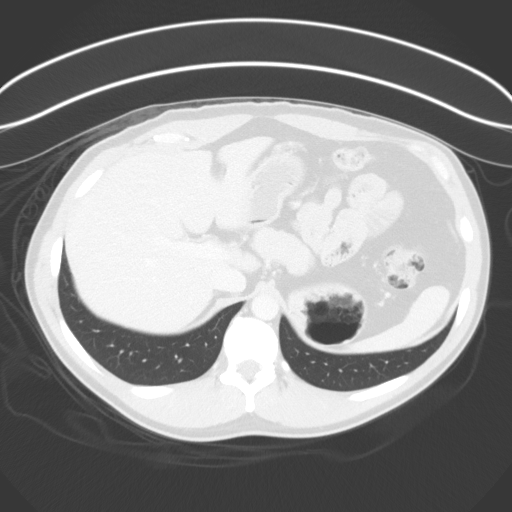
[im 94/113  soft-tissue]
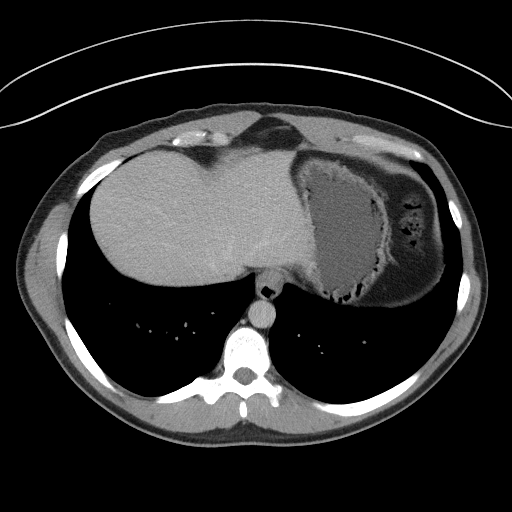
[im 94/113  lung]
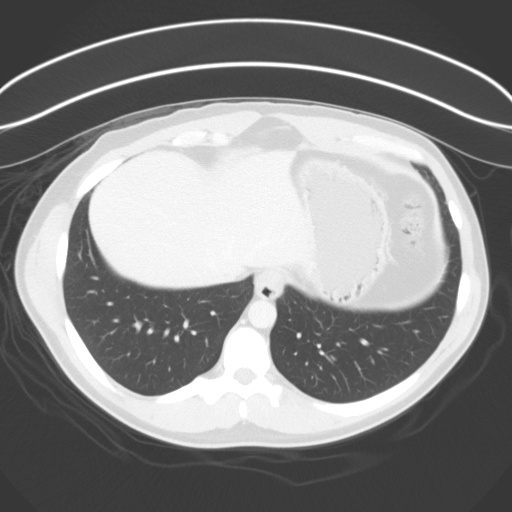
[im 94/113  bone]
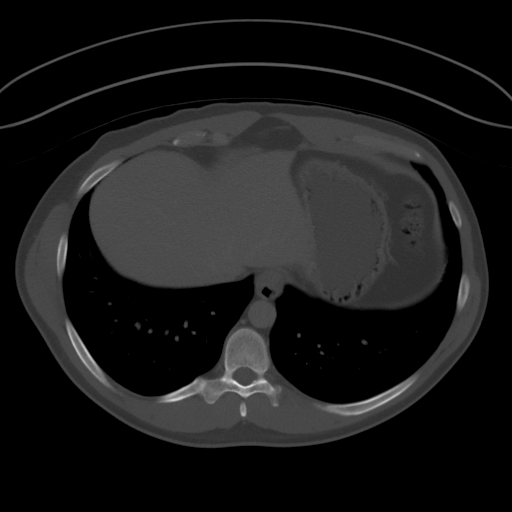
[im 103/113  soft-tissue]
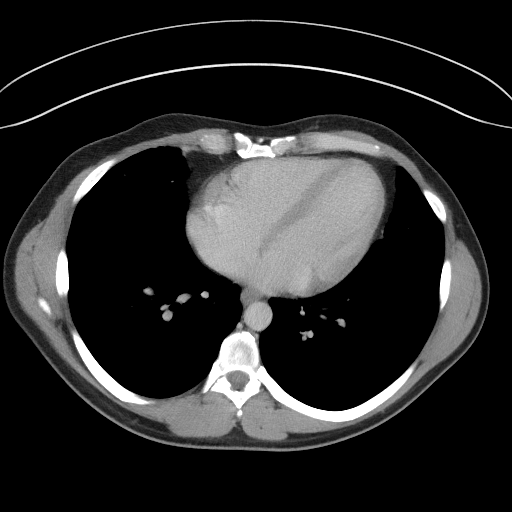
[im 103/113  lung]
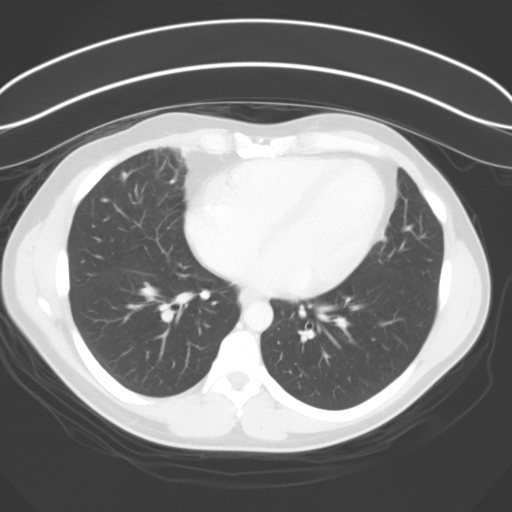

[13 of 46 positions shown; findings below may reference images not displayed]

FINDINGS: Lower chest: Unremarkable

Hepatobiliary: Unremarkable

Pancreas: Unremarkable

Spleen: Unremarkable

Adrenals/Urinary Tract: Adrenal glands normal.

4 mm right mid kidney nonobstructive renal calculus, image 60/5. No
hydronephrosis or additional urinary tract calculi. No abnormal
renal parenchymal enhancement. No appreciable focal filling defect
along the urothelium. Mild indentation of the bladder base by the
prostate gland.

Stomach/Bowel: Unremarkable

Vascular/Lymphatic: Unremarkable

Reproductive: Unremarkable

Other: No supplemental non-categorized findings.

Musculoskeletal: Unremarkable
IMPRESSION: 1. 4 mm right mid kidney nonobstructive renal calculus. No other
cause for hematuria identified.

## 2018-07-23 ENCOUNTER — Ambulatory Visit
Admission: RE | Admit: 2018-07-23 | Discharge: 2018-07-23 | Disposition: A | Discharge status: Home / Self Care | Payer: Commercial Managed Care - PPO | Source: Ambulatory Visit | Attending: Urology | Admitting: Urology

## 2018-07-23 ENCOUNTER — Encounter: Payer: Self-pay | Admitting: Urology

## 2018-07-23 ENCOUNTER — Ambulatory Visit: Payer: Commercial Managed Care - PPO | Admitting: Urology

## 2018-07-23 VITALS — BP 141/78 | HR 60 | Ht 71.0 in | Wt 189.0 lb

## 2018-07-23 DIAGNOSIS — R310 Gross hematuria: Secondary | ICD-10-CM | POA: Insufficient documentation

## 2018-07-23 DIAGNOSIS — Z87442 Personal history of urinary calculi: Secondary | ICD-10-CM | POA: Diagnosis not present

## 2018-07-23 DIAGNOSIS — N200 Calculus of kidney: Secondary | ICD-10-CM

## 2018-07-23 DIAGNOSIS — I878 Other specified disorders of veins: Secondary | ICD-10-CM | POA: Diagnosis not present

## 2018-07-23 DIAGNOSIS — R319 Hematuria, unspecified: Secondary | ICD-10-CM | POA: Diagnosis not present

## 2018-07-23 LAB — URINALYSIS, COMPLETE
BILIRUBIN UA: NEGATIVE
GLUCOSE, UA: NEGATIVE
KETONES UA: NEGATIVE
Leukocytes, UA: NEGATIVE
NITRITE UA: NEGATIVE
Protein, UA: NEGATIVE
Specific Gravity, UA: 1.025 (ref 1.005–1.030)
Urobilinogen, Ur: 0.2 mg/dL (ref 0.2–1.0)
pH, UA: 5.5 (ref 5.0–7.5)

## 2018-07-23 LAB — MICROSCOPIC EXAMINATION
EPITHELIAL CELLS (NON RENAL): NONE SEEN /HPF (ref 0–10)
RBC, UA: >30 /hpf — ABNORMAL HIGH (ref 0–2)
WBC, UA: NONE SEEN /hpf (ref 0–5)

## 2018-07-23 NOTE — Progress Notes (Signed)
07/23/2018 2:37 PM   Jay Price. 1976-06-05 [id number]  Referring provider: Owens Loffler, MD [address] [place], [place] [address]  Chief Complaint  Patient presents with  . Hematuria    6 month w/KUB    HPI: 42 year old male previously seen for an episode of total gross painless hematuria.  A CT urogram showed a nonobstructing 4 mm right renal calculus and cystoscopy was remarkable for hypervascularity of the prostatic urethra and bladder neck with inflammatory changes.  A six-month follow-up KUB was recommended.  He states last week he had recurrent hematuria which is at the end of his urinary stream.  He denies pain or discomfort.  He specifically denies flank/abdominal/pelvic pain.  He has no fever or chills.  A KUB performed today shows a stable 4 mm right renal calculus.   PMH: Past Medical History:  Diagnosis Date  . Arm fracture    distant  . Heart murmur    Trace MR and TR on 2012 echo  . Seizures ([place])    Distantly, had about 5 seizures, usually associated with great fatigue and strain. Last 10 years ago. Tonic-clonic activity described. Post-ictal state.    Surgical History: No past surgical history on file.  Home Medications:  Allergies as of 07/23/2018   No Known Allergies     Medication List    as of 07/23/2018  2:37 PM   You have not been prescribed any medications.     Allergies: No Known Allergies  Family History: Family History  Problem Relation Age of Onset  . Prostate cancer Neg Hx   . Bladder Cancer Neg Hx   . Kidney Stones Neg Hx     Social History:  reports that he has never smoked. He has never used smokeless tobacco. He reports that he drinks alcohol. He reports that he does not use drugs.  ROS: UROLOGY Frequent Urination?: No Hard to postpone urination?: No Burning/pain with urination?: No Get up at night to urinate?: Yes Leakage of urine?: No Urine stream starts and stops?: Yes Trouble starting  stream?: No Do you have to strain to urinate?: No Blood in urine?: Yes Urinary tract infection?: No Sexually transmitted disease?: No Injury to kidneys or bladder?: No Painful intercourse?: No Weak stream?: No Erection problems?: No Penile pain?: No  Gastrointestinal Nausea?: No Vomiting?: No Indigestion/heartburn?: No Diarrhea?: No Constipation?: Yes  Constitutional Fever: No Night sweats?: No Weight loss?: No Fatigue?: No  Skin Skin rash/lesions?: No Itching?: No  Eyes Blurred vision?: Yes Double vision?: No  Ears/Nose/Throat Sore throat?: No Sinus problems?: No  Hematologic/Lymphatic Swollen glands?: Yes Easy bruising?: No  Cardiovascular Leg swelling?: No Chest pain?: No  Respiratory Cough?: No Shortness of breath?: No  Endocrine Excessive thirst?: No  Musculoskeletal Back pain?: No Joint pain?: No  Neurological Headaches?: No Dizziness?: No  Psychologic Depression?: No Anxiety?: No  Physical Exam: BP (!) 141/78   Pulse 60   Ht 5\' 11"  (1.803 m)   Wt 189 lb (85.7 kg)   BMI 26.36 kg/m   Constitutional:  Alert and oriented, No acute distress. HEENT: [place] AT, moist mucus membranes.  Trachea midline, no masses. Cardiovascular: No clubbing, cyanosis, or edema. Respiratory: Normal respiratory effort, no increased work of breathing. Psychiatric: Normal mood and affect.   Assessment & Plan:    1. Gross hematuria 42 year old male with recurrent gross hematuria.  A urine culture was ordered.  He has a stable, nonobstructing right renal calculus which would not be a  source of his hematuria.  We again discussed this is most likely prostatic in etiology.  If his hematuria progresses he will call back and would recommend repeat cystoscopy while actively bleeding.  Since he was recently evaluated will not repeat any studies/testing at this point unless he recurs or worsens.  Return in about 1 year (around 07/24/2019) for Recheck, KUB.   Abbie Sons, [place] [address], [place], [place] [address] [phone]

## 2018-07-24 ENCOUNTER — Encounter: Payer: Self-pay | Admitting: Urology

## 2018-07-25 LAB — CULTURE, URINE COMPREHENSIVE

## 2018-08-05 ENCOUNTER — Ambulatory Visit: Payer: Commercial Managed Care - PPO | Admitting: Urology

## 2018-09-15 DIAGNOSIS — Z1283 Encounter for screening for malignant neoplasm of skin: Secondary | ICD-10-CM | POA: Diagnosis not present

## 2018-09-15 DIAGNOSIS — D225 Melanocytic nevi of trunk: Secondary | ICD-10-CM | POA: Diagnosis not present

## 2018-09-15 DIAGNOSIS — D485 Neoplasm of uncertain behavior of skin: Secondary | ICD-10-CM | POA: Diagnosis not present

## 2018-09-15 DIAGNOSIS — D239 Other benign neoplasm of skin, unspecified: Secondary | ICD-10-CM

## 2018-09-15 DIAGNOSIS — D1800 Hemangioma unspecified site: Secondary | ICD-10-CM | POA: Diagnosis not present

## 2018-09-15 DIAGNOSIS — L905 Scar conditions and fibrosis of skin: Secondary | ICD-10-CM | POA: Diagnosis not present

## 2018-09-15 HISTORY — DX: Other benign neoplasm of skin, unspecified: D23.9

## 2018-12-17 DIAGNOSIS — L719 Rosacea, unspecified: Secondary | ICD-10-CM | POA: Diagnosis not present

## 2019-02-05 ENCOUNTER — Other Ambulatory Visit: Payer: Self-pay | Admitting: Family Medicine

## 2019-02-05 DIAGNOSIS — Z0000 Encounter for general adult medical examination without abnormal findings: Secondary | ICD-10-CM

## 2019-02-05 DIAGNOSIS — Z131 Encounter for screening for diabetes mellitus: Secondary | ICD-10-CM

## 2019-02-05 DIAGNOSIS — Z113 Encounter for screening for infections with a predominantly sexual mode of transmission: Secondary | ICD-10-CM

## 2019-02-10 ENCOUNTER — Other Ambulatory Visit: Payer: Commercial Managed Care - PPO

## 2019-02-12 ENCOUNTER — Encounter: Payer: Commercial Managed Care - PPO | Admitting: Family Medicine

## 2019-04-24 ENCOUNTER — Other Ambulatory Visit (INDEPENDENT_AMBULATORY_CARE_PROVIDER_SITE_OTHER): Payer: Commercial Managed Care - PPO

## 2019-04-24 DIAGNOSIS — Z0000 Encounter for general adult medical examination without abnormal findings: Secondary | ICD-10-CM

## 2019-04-24 DIAGNOSIS — Z113 Encounter for screening for infections with a predominantly sexual mode of transmission: Secondary | ICD-10-CM

## 2019-04-24 DIAGNOSIS — Z131 Encounter for screening for diabetes mellitus: Secondary | ICD-10-CM

## 2019-04-24 LAB — CBC WITH DIFFERENTIAL/PLATELET
Basophils Absolute: 0 10*3/uL (ref 0.0–0.1)
Basophils Relative: 0.5 % (ref 0.0–3.0)
Eosinophils Absolute: 0.2 10*3/uL (ref 0.0–0.7)
Eosinophils Relative: 2.6 % (ref 0.0–5.0)
HCT: 43.7 % (ref 39.0–52.0)
Hemoglobin: 14.8 g/dL (ref 13.0–17.0)
Lymphocytes Relative: 33.5 % (ref 12.0–46.0)
Lymphs Abs: 2.1 10*3/uL (ref 0.7–4.0)
MCHC: 33.9 g/dL (ref 30.0–36.0)
MCV: 89.4 fl (ref 78.0–100.0)
Monocytes Absolute: 0.5 10*3/uL (ref 0.1–1.0)
Monocytes Relative: 7.9 % (ref 3.0–12.0)
Neutro Abs: 3.5 10*3/uL (ref 1.4–7.7)
Neutrophils Relative %: 55.5 % (ref 43.0–77.0)
Platelets: 245 10*3/uL (ref 150.0–400.0)
RBC: 4.89 Mil/uL (ref 4.22–5.81)
RDW: 12.9 % (ref 11.5–15.5)
WBC: 6.3 10*3/uL (ref 4.0–10.5)

## 2019-04-24 LAB — HEPATIC FUNCTION PANEL
ALT: 27 U/L (ref 0–53)
AST: 19 U/L (ref 0–37)
Albumin: 4.4 g/dL (ref 3.5–5.2)
Alkaline Phosphatase: 75 U/L (ref 39–117)
Bilirubin, Direct: 0.2 mg/dL (ref 0.0–0.3)
Total Bilirubin: 0.9 mg/dL (ref 0.2–1.2)
Total Protein: 6.8 g/dL (ref 6.0–8.3)

## 2019-04-24 LAB — BASIC METABOLIC PANEL
BUN: 16 mg/dL (ref 6–23)
CO2: 31 mEq/L (ref 19–32)
Calcium: 9.7 mg/dL (ref 8.4–10.5)
Chloride: 103 mEq/L (ref 96–112)
Creatinine, Ser: 1.07 mg/dL (ref 0.40–1.50)
GFR: 75.64 mL/min (ref >60.00)
Glucose, Bld: 96 mg/dL (ref 70–99)
Potassium: 4.5 mEq/L (ref 3.5–5.1)
Sodium: 140 mEq/L (ref 135–145)

## 2019-04-24 LAB — LIPID PANEL
Cholesterol: 166 mg/dL (ref 0–200)
HDL: 41.2 mg/dL (ref >39.00)
LDL Cholesterol: 111 mg/dL — ABNORMAL HIGH (ref 0–99)
NonHDL: 124.33
Total CHOL/HDL Ratio: 4
Triglycerides: 68 mg/dL (ref 0.0–149.0)
VLDL: 13.6 mg/dL (ref 0.0–40.0)

## 2019-04-24 LAB — HEMOGLOBIN A1C: Hgb A1c MFr Bld: 5.9 % (ref 4.6–6.5)

## 2019-04-27 LAB — RPR: RPR Ser Ql: NONREACTIVE

## 2019-04-27 LAB — TRICHOMONAS VAGINALIS RNA, QL,MALES: Trichomonas vaginalis RNA: NOT DETECTED

## 2019-04-27 LAB — HIV ANTIBODY (ROUTINE TESTING W REFLEX): HIV 1&2 Ab, 4th Generation: NONREACTIVE

## 2019-04-27 LAB — C. TRACHOMATIS/N. GONORRHOEAE RNA
C. trachomatis RNA, TMA: NOT DETECTED
N. gonorrhoeae RNA, TMA: NOT DETECTED

## 2019-04-29 ENCOUNTER — Encounter: Payer: Self-pay | Admitting: Family Medicine

## 2019-04-29 ENCOUNTER — Other Ambulatory Visit: Payer: Self-pay

## 2019-04-29 ENCOUNTER — Ambulatory Visit (INDEPENDENT_AMBULATORY_CARE_PROVIDER_SITE_OTHER): Payer: Commercial Managed Care - PPO | Admitting: Family Medicine

## 2019-04-29 VITALS — BP 100/60 | HR 66 | Temp 98.3°F | Ht 71.5 in | Wt 176.8 lb

## 2019-04-29 DIAGNOSIS — Z0000 Encounter for general adult medical examination without abnormal findings: Secondary | ICD-10-CM | POA: Diagnosis not present

## 2019-04-29 DIAGNOSIS — R6882 Decreased libido: Secondary | ICD-10-CM | POA: Diagnosis not present

## 2019-04-29 NOTE — Progress Notes (Signed)
Derrik Mceachern T. Annasofia Pohl, MD Primary Care and [place] [facility] [place], [address] Phone: [phone]  FAX: [place]. - 42 y.o. male  MRN [id number]  Date of Birth: 1976-02-12  Visit Date: 04/29/2019  PCP: Owens Loffler, MD  Referred by: Owens Loffler, MD  Chief Complaint  Patient presents with  . Annual Exam   Patient Care Team: Owens Loffler, MD as PCP - General (Family Medicine) Subjective:   Jay Price. is a 43 y.o. pleasant patient who presents with the following:  Preventative Health Maintenance Visit:  Health Maintenance Summary Reviewed and updated, unless pt declines services.  Tobacco History Reviewed. Alcohol: No concerns, no excessive use Exercise Habits: Some activity, rec at least 30 mins 5 times a week STD concerns: no risk or activity to increase risk Drug Use: None Encouraged self-testicular check  Had been working out a lot, four or five times a week. Did have some urine bleeding.   Has had some blood in his stool.  With constipation.  Will get some stomach pain.   Was peeing about 8 - 10 times a day.  Not sleeping.     Health Maintenance  Topic Date Due  . INFLUENZA VACCINE  06/20/2019  . TETANUS/TDAP  01/09/2027  . HIV Screening  Completed   Immunization History  Administered Date(s) Administered  . Tdap 01/09/2017   Patient Active Problem List   Diagnosis Date Noted  . Nephrolithiasis 02/05/2018  . Murmur, cardiac 05/31/2011   Past Medical History:  Diagnosis Date  . Arm fracture    distant  . Heart murmur    Trace MR and TR on 2012 echo  . Seizures ([place])    Distantly, had about 5 seizures, usually associated with great fatigue and strain. Last 10 years ago. Tonic-clonic activity described. Post-ictal state.   History reviewed. No pertinent surgical history. Social History   Socioeconomic History  . Marital status:  Divorced    Spouse name: Not on file  . Number of children: Not on file  . Years of education: Not on file  . Highest education level: Not on file  Occupational History  . Not on file  Social Needs  . Financial resource strain: Not on file  . Food insecurity:    Worry: Not on file    Inability: Not on file  . Transportation needs:    Medical: Not on file    Non-medical: Not on file  Tobacco Use  . Smoking status: Never Smoker  . Smokeless tobacco: Never Used  Substance and Sexual Activity  . Alcohol use: Yes    Alcohol/week: 0.0 standard drinks    Comment: Socially   . Drug use: No  . Sexual activity: Yes    Birth control/protection: None  Lifestyle  . Physical activity:    Days per week: Not on file    Minutes per session: Not on file  . Stress: Not on file  Relationships  . Social connections:    Talks on phone: Not on file    Gets together: Not on file    Attends religious service: Not on file    Active member of club or organization: Not on file    Attends meetings of clubs or organizations: Not on file    Relationship status: Not on file  . Intimate partner violence:    Fear of current or ex partner: Not on file  Emotionally abused: Not on file    Physically abused: Not on file    Forced sexual activity: Not on file  Other Topics Concern  . Not on file  Social History Narrative  . Not on file   Family History  Problem Relation Age of Onset  . Prostate cancer Neg Hx   . Bladder Cancer Neg Hx   . Kidney Stones Neg Hx    No Known Allergies  Medication list has been reviewed and updated.   General: Denies fever, chills, sweats. No significant weight loss. Eyes: Denies blurring,significant itching ENT: Denies earache, sore throat, and hoarseness. Cardiovascular: Denies chest pains, palpitations, dyspnea on exertion Respiratory: Denies cough, dyspnea at rest,wheeezing Breast: no concerns about lumps GI: Denies nausea, vomiting, diarrhea, constipation,  change in bowel habits, abdominal pain, melena, hematochezia GU: Denies penile discharge, ED, urinary flow / outflow problems. No STD concerns. Musculoskeletal: Denies back pain, joint pain Derm: Denies rash, itching Neuro: Denies  paresthesias, frequent falls, frequent headaches Psych: Denies depression, anxiety Endocrine: Denies cold intolerance, heat intolerance, polydipsia Heme: Denies enlarged lymph nodes Allergy: No hayfever  Objective:   BP 100/60   Pulse 66   Temp 98.3 F (36.8 C) (Oral)   Ht 5' 11.5" (1.816 m)   Wt 176 lb 12 oz (80.2 kg)   BMI 24.31 kg/m  Ideal Body Weight: Weight in (lb) to have BMI = 25: 181.4  Ideal Body Weight: Weight in (lb) to have BMI = 25: 181.4 No exam data present Depression screen [facility] 2/9 04/29/2019 01/20/2018  Decreased Interest 0 0  Down, Depressed, Hopeless 0 0  PHQ - 2 Score 0 0     GEN: well developed, well nourished, no acute distress Eyes: conjunctiva and lids normal, PERRLA, EOMI ENT: TM clear, nares clear, oral exam WNL Neck: supple, no lymphadenopathy, no thyromegaly, no JVD Pulm: clear to auscultation and percussion, respiratory effort normal CV: regular rate and rhythm, S1-S2, no murmur, rub or gallop, no bruits, peripheral pulses normal and symmetric, no cyanosis, clubbing, edema or varicosities GI: soft, non-tender; no hepatosplenomegaly, masses; active bowel sounds all quadrants GU: no hernia, testicular mass, penile discharge Lymph: no cervical, axillary or inguinal adenopathy MSK: gait normal, muscle tone and strength WNL, no joint swelling, effusions, discoloration, crepitus  SKIN: clear, good turgor, color WNL, no rashes, lesions, or ulcerations Neuro: normal mental status, normal strength, sensation, and motion Psych: alert; oriented to person, place and time, normally interactive and not anxious or depressed in appearance.  All labs reviewed with patient. Results for orders placed or performed in visit on 04/24/19   C. trachomatis/N. gonorrhoeae RNA  Result Value Ref Range   C. trachomatis RNA, TMA NOT DETECTED NOT DETECT   N. gonorrhoeae RNA, TMA NOT DETECTED NOT DETECT  Lipid panel  Result Value Ref Range   Cholesterol 166 0 - 200 mg/dL   Triglycerides 68.0 0.0 - 149.0 mg/dL   HDL 41.20 >39.00 mg/dL   VLDL 13.6 0.0 - 40.0 mg/dL   LDL Cholesterol 111 (H) 0 - 99 mg/dL   Total CHOL/HDL Ratio 4    NonHDL 124.33   HIV Antibody (routine testing w rflx)  Result Value Ref Range   HIV 1&2 Ab, 4th Generation NON-REACTIVE NON-REACTI  RPR  Result Value Ref Range   RPR Ser Ql NON-REACTIVE NON-REACTI  Hemoglobin A1c  Result Value Ref Range   Hgb A1c MFr Bld 5.9 4.6 - 6.5 %  Basic metabolic panel  Result Value Ref Range  Sodium 140 135 - 145 mEq/L   Potassium 4.5 3.5 - 5.1 mEq/L   Chloride 103 96 - 112 mEq/L   CO2 31 19 - 32 mEq/L   Glucose, Bld 96 70 - 99 mg/dL   BUN 16 6 - 23 mg/dL   Creatinine, Ser 1.07 0.40 - 1.50 mg/dL   Calcium 9.7 8.4 - 10.5 mg/dL   GFR 75.64 >60.00 mL/min  Hepatic function panel  Result Value Ref Range   Total Bilirubin 0.9 0.2 - 1.2 mg/dL   Bilirubin, Direct 0.2 0.0 - 0.3 mg/dL   Alkaline Phosphatase 75 39 - 117 U/L   AST 19 0 - 37 U/L   ALT 27 0 - 53 U/L   Total Protein 6.8 6.0 - 8.3 g/dL   Albumin 4.4 3.5 - 5.2 g/dL  CBC with Differential/Platelet  Result Value Ref Range   WBC 6.3 4.0 - 10.5 K/uL   RBC 4.89 4.22 - 5.81 Mil/uL   Hemoglobin 14.8 13.0 - 17.0 g/dL   HCT 43.7 39.0 - 52.0 %   MCV 89.4 78.0 - 100.0 fl   MCHC 33.9 30.0 - 36.0 g/dL   RDW 12.9 11.5 - 15.5 %   Platelets 245.0 150.0 - 400.0 K/uL   Neutrophils Relative % 55.5 43.0 - 77.0 %   Lymphocytes Relative 33.5 12.0 - 46.0 %   Monocytes Relative 7.9 3.0 - 12.0 %   Eosinophils Relative 2.6 0.0 - 5.0 %   Basophils Relative 0.5 0.0 - 3.0 %   Neutro Abs 3.5 1.4 - 7.7 K/uL   Lymphs Abs 2.1 0.7 - 4.0 K/uL   Monocytes Absolute 0.5 0.1 - 1.0 K/uL   Eosinophils Absolute 0.2 0.0 - 0.7 K/uL   Basophils  Absolute 0.0 0.0 - 0.1 K/uL  Trichomonas vaginalis RNA, Ql,Males  Result Value Ref Range   Trichomonas vaginalis RNA NOT DETECTED NOT DETECT    Assessment and Plan:   Healthcare maintenance  Low libido - Plan: Testos,Total,Free and SHBG (Male)  Doing really well  Some low libido so check test  Health Maintenance Exam: The patient's preventative maintenance and recommended screening tests for an annual wellness exam were reviewed in full today. Brought up to date unless services declined.  Counselled on the importance of diet, exercise, and its role in overall health and mortality. The patient's FH and SH was reviewed, including their home life, tobacco status, and drug and alcohol status.  Follow-up in 1 year for physical exam or additional follow-up below.  Follow-up: No follow-ups on file. Or follow-up in 1 year if not noted.  No orders of the defined types were placed in this encounter.  There are no discontinued medications. Orders Placed This Encounter  Procedures  . Testos,Total,Free and SHBG (Male)    Signed,  Frederico Hamman T. Dystany Duffy, MD   Allergies as of 04/29/2019   No Known Allergies     Medication List       Accurate as of April 29, 2019  2:01 PM. If you have any questions, ask your nurse or doctor.        doxycycline 50 MG capsule Commonly known as:  VIBRAMYCIN TAKE 1 CAPSULE BY MOUTH EVERY DAY WITH FOOD

## 2019-05-02 LAB — TESTOS,TOTAL,FREE AND SHBG (FEMALE)
Free Testosterone: 73.1 pg/mL (ref 35.0–155.0)
Sex Hormone Binding: 22 nmol/L (ref 10–50)
Testosterone, Total, LC-MS-MS: 389 ng/dL (ref 250–1100)

## 2019-05-05 ENCOUNTER — Encounter: Payer: Self-pay | Admitting: *Deleted

## 2019-07-28 ENCOUNTER — Ambulatory Visit: Payer: Commercial Managed Care - PPO | Admitting: Urology

## 2019-07-29 ENCOUNTER — Ambulatory Visit
Admission: RE | Admit: 2019-07-29 | Discharge: 2019-07-29 | Disposition: A | Discharge status: Home / Self Care | Payer: Commercial Managed Care - PPO | Source: Ambulatory Visit | Attending: Urology | Admitting: Urology

## 2019-07-29 ENCOUNTER — Other Ambulatory Visit: Payer: Self-pay

## 2019-07-29 ENCOUNTER — Ambulatory Visit: Payer: Commercial Managed Care - PPO | Admitting: Urology

## 2019-07-29 ENCOUNTER — Encounter: Payer: Self-pay | Admitting: Urology

## 2019-07-29 VITALS — BP 106/55 | HR 56 | Ht 71.0 in | Wt 178.0 lb

## 2019-07-29 DIAGNOSIS — Z87448 Personal history of other diseases of urinary system: Secondary | ICD-10-CM

## 2019-07-29 DIAGNOSIS — Z87898 Personal history of other specified conditions: Secondary | ICD-10-CM

## 2019-07-29 DIAGNOSIS — N200 Calculus of kidney: Secondary | ICD-10-CM | POA: Insufficient documentation

## 2019-07-30 LAB — URINALYSIS, COMPLETE
Bilirubin, UA: NEGATIVE
Glucose, UA: NEGATIVE
Ketones, UA: NEGATIVE
Leukocytes,UA: NEGATIVE
Nitrite, UA: NEGATIVE
Protein,UA: NEGATIVE
RBC, UA: NEGATIVE
Specific Gravity, UA: 1.025 (ref 1.005–1.030)
Urobilinogen, Ur: 0.2 mg/dL (ref 0.2–1.0)
pH, UA: 6.5 (ref 5.0–7.5)

## 2019-07-30 LAB — MICROSCOPIC EXAMINATION
Bacteria, UA: NONE SEEN
Epithelial Cells (non renal): NONE SEEN /hpf (ref 0–10)
RBC: NONE SEEN /hpf (ref 0–2)

## 2019-08-01 ENCOUNTER — Encounter: Payer: Self-pay | Admitting: Urology

## 2019-08-01 DIAGNOSIS — Z87448 Personal history of other diseases of urinary system: Secondary | ICD-10-CM | POA: Insufficient documentation

## 2019-08-01 DIAGNOSIS — Z87898 Personal history of other specified conditions: Secondary | ICD-10-CM | POA: Insufficient documentation

## 2019-08-01 NOTE — Progress Notes (Signed)
07/29/2019 9:41 AM   Jay Price. 1976-03-02 [id number]  Referring provider: Owens Loffler, MD [place],  [place] [address]  Chief Complaint  Patient presents with   Follow-up    HPI: 43 y.o. male previously evaluated for an episode of total gross painless hematuria March 2019.  CTU showed a 4 mm nonobstructing right renal calculus and cystoscopy remarkable for hypervascularity of the prostatic urethra and bladder neck with inflammatory change.  He denies recurrent hematuria.  He has no flank or abdominal pain.   PMH: Past Medical History:  Diagnosis Date   Arm fracture    distant   Heart murmur    Trace MR and TR on 2012 echo   History of hematuria    Kidney stone    Seizures ([place])    Distantly, had about 5 seizures, usually associated with great fatigue and strain. Last 10 years ago. Tonic-clonic activity described. Post-ictal state.    Surgical History: No past surgical history on file.  Home Medications:  Allergies as of 07/29/2019   No Known Allergies     Medication List       Accurate as of July 29, 2019 11:59 PM. If you have any questions, ask your nurse or doctor.        STOP taking these medications   doxycycline 50 MG capsule Commonly known as: VIBRAMYCIN Stopped by: Abbie Sons, MD       Allergies: No Known Allergies  Family History: Family History  Problem Relation Age of Onset   Prostate cancer Neg Hx    Bladder Cancer Neg Hx    Kidney Stones Neg Hx     Social History:  reports that he has never smoked. He has never used smokeless tobacco. He reports current alcohol use. He reports that he does not use drugs.  ROS: UROLOGY Frequent Urination?: No Hard to postpone urination?: No Burning/pain with urination?: No Get up at night to urinate?: No Leakage of urine?: No Urine stream starts and stops?: No Trouble starting stream?: No Do you have to strain to urinate?: No Blood in urine?:  Yes Urinary tract infection?: No Sexually transmitted disease?: No Injury to kidneys or bladder?: No Painful intercourse?: No Weak stream?: No Erection problems?: No Penile pain?: No  Gastrointestinal Nausea?: No Vomiting?: No Indigestion/heartburn?: No Diarrhea?: No Constipation?: No  Constitutional Fever: No Night sweats?: No Weight loss?: No Fatigue?: No  Skin Skin rash/lesions?: No Itching?: No  Eyes Blurred vision?: No Double vision?: No  Ears/Nose/Throat Sore throat?: No Sinus problems?: No  Hematologic/Lymphatic Swollen glands?: No Easy bruising?: No  Cardiovascular Leg swelling?: No Chest pain?: No  Respiratory Cough?: No Shortness of breath?: No  Endocrine Excessive thirst?: No  Musculoskeletal Back pain?: No Joint pain?: No  Neurological Headaches?: No Dizziness?: No  Psychologic Depression?: No Anxiety?: No  Physical Exam: BP (!) 106/55 (BP Location: Left Arm, Patient Position: Sitting, Cuff Size: Normal)    Pulse (!) 56    Ht 5\' 11"  (1.803 m)    Wt 178 lb (80.7 kg)    BMI 24.83 kg/m   Constitutional:  Alert and oriented, No acute distress. HEENT: [place] AT, moist mucus membranes.  Trachea midline, no masses. Cardiovascular: No clubbing, cyanosis, or edema. Respiratory: Normal respiratory effort, no increased work of breathing. Skin: No rashes, bruises or suspicious lesions. Neurologic: Grossly intact, no focal deficits, moving all 4 extremities. Psychiatric: Normal mood and affect.  Laboratory Data:  Urinalysis Dipstick/microscopy negative  Pertinent  Imaging: KUB performed today was reviewed and there is a stable 4 mm midpole right renal calculus.  Assessment & Plan:    - History gross hematuria No recurrent episodes.  Urinalysis today clear  - Nephrolithiasis Stable right renal calculus   Abbie Sons, MD  [place] [address], [place], [place] [address] [phone]

## 2020-02-11 ENCOUNTER — Telehealth: Payer: Self-pay

## 2020-02-11 NOTE — Telephone Encounter (Signed)
[facility] - Client TELEPHONE ADVICE RECORD [facility] Patient Name: Jay Price Gender: Male DOB: January 11, 1976 Age: 44 Y 2 M 15 D Return Phone Number: [phone] (Primary) Address: City/State/Zip: [place] [address] Client [facility] - Client Client Site [place] - Day Physician [place], Frederico Hamman - MD Contact Type Call Who Is Calling Patient / Member / Family / Caregiver Call Type Triage / Clinical Relationship To Patient Self Return Phone Number [phone] (Primary) Chief Complaint BREATHING - shortness of breath or sounds breathless Reason for Call Symptomatic / Request for [place] states he has lower right side pain. The pain is in the lower back and he is having trouble breathing and walking because of the pain. The past couple of hours it has gotten worse. Translation No Nurse Assessment Nurse: Joline Salt, RN, Malachy Mood Date/Time Eilene Ghazi Time): 02/11/2020 1:13:28 PM Confirm and document reason for call. If symptomatic, describe symptoms. ---Caller states he has lower right side pain. The pain is in the lower back and he is having trouble breathing and walking because of the pain. The past couple of hours it has gotten worse. He did work out last night. Has the patient had close contact with a person known or suspected to have the novel coronavirus illness OR traveled / lives in area with major community spread (including international travel) in the last 14 days from the onset of symptoms? * If Asymptomatic, screen for exposure and travel within the last 14 days. ---No Does the patient have any new or worsening symptoms? ---Yes Will a triage be completed? ---Yes Related visit to physician within the last 2 weeks? ---No Does the PT have any chronic conditions? (i.e. diabetes, asthma, this includes High risk factors for pregnancy, etc.) ---No Is this a  behavioral health or substance abuse call? ---No Guidelines Guideline Title Affirmed Question Affirmed Notes Nurse Date/Time (Eastern Time) Back Pain [1] MODERATE back pain (e.g., interferes with normal activities) AND [2] present > 3 days Catha Brow 02/11/2020 1:20:41 PM PLEASE NOTE: All timestamps contained within this report are represented as [place] Standard Time. CONFIDENTIALTY NOTICE: This fax transmission is intended only for the addressee. It contains information that is legally privileged, confidential or otherwise protected from use or disclosure. If you are not the intended recipient, you are strictly prohibited from reviewing, disclosing, copying using or disseminating any of this information or taking any action in reliance on or regarding this information. If you have received this fax in error, please notify us immediately by telephone so that we can arrange for its return to [place]. Phone: [phone], Toll-Free: [phone], Fax: [phone] Page: 2 of 2 Call Id: [id number] [place]. Time Eilene Ghazi Time) Disposition Final User 02/11/2020 1:10:44 PM Send to Urgent Queue [place], Tillie Rung 02/11/2020 1:21:18 PM SEE PCP WITHIN 3 DAYS Yes Joline Salt, RN, Malachy Mood Caller Disagree/Comply Comply Caller Understands Yes PreDisposition Did not know what to do Care Advice Given Per Guideline SEE PCP WITHIN 3 DAYS: LOCAL HEAT: CALL BACK IF: * You become worse. Comments User: Margretta Ditty, RN Date/Time Eilene Ghazi Time): 02/11/2020 1:15:54 PM Rates pain 4-5/10 Referrals REFERRED TO PCP OFFICE

## 2020-02-11 NOTE — Telephone Encounter (Signed)
I spoke with pt and he already has appt to see Dr Glori Bickers on 02/12/20 t 12 noon. UC & ED precautions given and pt voiced understanding.

## 2020-02-12 ENCOUNTER — Ambulatory Visit
Admission: RE | Admit: 2020-02-12 | Discharge: 2020-02-12 | Disposition: A | Discharge status: Home / Self Care | Payer: Commercial Managed Care - PPO | Source: Ambulatory Visit | Attending: Family Medicine | Admitting: Family Medicine

## 2020-02-12 ENCOUNTER — Ambulatory Visit: Payer: Commercial Managed Care - PPO | Admitting: Family Medicine

## 2020-02-12 ENCOUNTER — Encounter: Payer: Self-pay | Admitting: Family Medicine

## 2020-02-12 ENCOUNTER — Ambulatory Visit (INDEPENDENT_AMBULATORY_CARE_PROVIDER_SITE_OTHER)
Admission: RE | Admit: 2020-02-12 | Discharge: 2020-02-12 | Disposition: A | Discharge status: Home / Self Care | Payer: Commercial Managed Care - PPO | Source: Ambulatory Visit | Attending: Family Medicine | Admitting: Family Medicine

## 2020-02-12 ENCOUNTER — Other Ambulatory Visit: Payer: Self-pay

## 2020-02-12 VITALS — BP 106/74 | HR 57 | Temp 98.2°F | Ht 71.0 in | Wt 180.6 lb

## 2020-02-12 DIAGNOSIS — R3129 Other microscopic hematuria: Secondary | ICD-10-CM | POA: Insufficient documentation

## 2020-02-12 DIAGNOSIS — M545 Low back pain, unspecified: Secondary | ICD-10-CM | POA: Insufficient documentation

## 2020-02-12 DIAGNOSIS — N200 Calculus of kidney: Secondary | ICD-10-CM

## 2020-02-12 DIAGNOSIS — R109 Unspecified abdominal pain: Secondary | ICD-10-CM | POA: Insufficient documentation

## 2020-02-12 DIAGNOSIS — R1031 Right lower quadrant pain: Secondary | ICD-10-CM

## 2020-02-12 LAB — POC URINALSYSI DIPSTICK (AUTOMATED)
Bilirubin, UA: NEGATIVE
Blood, UA: 200
Glucose, UA: NEGATIVE
Ketones, UA: NEGATIVE
Leukocytes, UA: NEGATIVE
Nitrite, UA: NEGATIVE
Protein, UA: POSITIVE — AB
Spec Grav, UA: 1.015 (ref 1.010–1.025)
Urobilinogen, UA: 0.2 E.U./dL
pH, UA: 8 (ref 5.0–8.0)

## 2020-02-12 MED ORDER — TAMSULOSIN HCL 0.4 MG PO CAPS: 0.4000 mg | ORAL_CAPSULE | Freq: Every day | ORAL | 3 refills | Status: DC | PRN

## 2020-02-12 NOTE — Assessment & Plan Note (Addendum)
Today with R flank and back pain  (relieved by ibuprofen)     ua with rbc (standard for him with history)  KUB today -possible stone in R pelvis CT ordered

## 2020-02-12 NOTE — Assessment & Plan Note (Addendum)
In the context of known kidney stone/also flank and back pain  KUB-possible stone in R pelvis so CT ordered  Urine cx

## 2020-02-12 NOTE — Assessment & Plan Note (Addendum)
Flank and back pain  Per hx positional and responds completely to ibuprofen so not classic for renal stone  Nl exam - pt just took ibuprofen KUB-possible stone in R pelvis , CT ordered Urine cx sent

## 2020-02-12 NOTE — Progress Notes (Signed)
Subjective:    Patient ID: Jay Price., male    DOB: 01/12/1976, 44 y.o.   MRN: [id number]  This visit occurred during the SARS-CoV-2 public health emergency.  Safety protocols were in place, including screening questions prior to the visit, additional usage of staff PPE, and extensive cleaning of exam room while observing appropriate contact time as indicated for disinfecting solutions.    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 of Dr Lorelei Pont presents with low back pain   He has h/o kidney stones   Pain started yesterday at work  Twinge in R middle/low back -got worse and worse  Took 600 mg ibuprofen and it helped  By last night- worse again- took another one  Woke up -could barely get out of bed - used ice and took ibuprofen   It is triggered by position and movement  Taking a deep breath made it worse   He works out 6 days per week  Had worked on back and core the night before   ua Results for orders placed or performed in visit on 02/12/20  POCT Urinalysis Dipstick (Automated)  Result Value Ref Range   Color, UA Yellow    Clarity, UA Clear    Glucose, UA Negative Negative   Bilirubin, UA Negative    Ketones, UA Negaive    Spec Grav, UA 1.015 1.010 - 1.025   Blood, UA 200 Ery/uL    pH, UA 8.0 5.0 - 8.0   Protein, UA Positive (A) Negative   Urobilinogen, UA 0.2 0.2 or 1.0 E.U./dL   Nitrite, UA Negative    Leukocytes, UA Negative Negative    He has had a large workup for hematuria including cystoscopy and CT in the past     abd xray from 9/9 Abdomen 1 view (KUB) (Accession [id number]) (Order [id number]) Imaging Date: 07/29/2019 Department: [facility] Released By: Norman Herrlich Authorizing: Abbie Sons, MD  Exam Status  Status  Final [99]  PACS Intelerad Image Link  Show images for Abdomen 1 view (KUB)  Study Result  CLINICAL DATA:  Nephrolithiasis.  EXAM: ABDOMEN - 1 VIEW  COMPARISON:  Body CT January 31, 2018  FINDINGS: The bowel gas pattern is normal. Formed stool within the transverse colon overlies the renal shadows.  There is a suspected 4 mm calculus overlying the superior pole of the right kidney.  IMPRESSION: Suspected 4 mm calculus overlying the superior pole of the right kidney.   Electronically Signed   By: Fidela Salisbury M.D.   On: 07/30/2019 09:17   CT scan from 3/19 IMPRESSION: 1. 4 mm right mid kidney nonobstructive renal calculus. No other cause for hematuria identified.   Patient Active Problem List   Diagnosis Date Noted  . Right low back pain 02/12/2020  . Right flank pain 02/12/2020  . Microscopic hematuria 02/12/2020  . History of gross hematuria 08/01/2019  . Nephrolithiasis 02/05/2018  . Murmur, cardiac 05/31/2011   Past Medical History:  Diagnosis Date  . Arm fracture    distant  . Heart murmur    Trace MR and TR on 2012 echo  . History of hematuria   . Kidney stone   . 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 Last 10 years ago. Tonic-clonic activity described. Post-ictal state.   History reviewed. No pertinent surgical history. Social History   Tobacco Use  . Smoking status: Never Smoker  . Smokeless  tobacco: Never Used  Substance Use Topics  . Alcohol use: Yes    Alcohol/week: 0.0 standard drinks    Comment: Socially   . Drug use: No   Family History  Problem Relation Age of Onset  . Prostate cancer Neg Hx   . Bladder Cancer Neg Hx   . Kidney Stones Neg Hx    No Known Allergies No current outpatient medications on file prior to visit.   No current facility-administered medications on file prior to visit.    Review of Systems  Constitutional: Negative for activity change, appetite change, fatigue, fever and unexpected weight change.  HENT: Negative for congestion, rhinorrhea, sore throat and trouble swallowing.   Eyes: Negative for pain, redness, itching  and visual disturbance.  Respiratory: Negative for cough, chest tightness, shortness of breath and wheezing.   Cardiovascular: Negative for chest pain and palpitations.  Gastrointestinal: Negative for abdominal pain, blood in stool, constipation, diarrhea and nausea.  Endocrine: Negative for cold intolerance, heat intolerance, polydipsia and polyuria.  Genitourinary: Negative for difficulty urinating, dysuria, frequency, hematuria, testicular pain and urgency.       No more frequency than usual  Musculoskeletal: Positive for back pain. Negative for arthralgias, joint swelling and myalgias.  Skin: Negative for pallor and rash.  Neurological: Negative for dizziness, tremors, weakness, numbness and headaches.  Hematological: Negative for adenopathy. Does not bruise/bleed easily.  Psychiatric/Behavioral: Negative for decreased concentration and dysphoric mood. The patient is not nervous/anxious.        Objective:   Physical Exam Constitutional:      General: He is not in acute distress.    Appearance: Normal appearance. He is well-developed.  HENT:     Head: Normocephalic and atraumatic.  Eyes:     General: No scleral icterus.    Conjunctiva/sclera: Conjunctivae normal.     Pupils: Pupils are equal, round, and reactive to light.  Cardiovascular:     Rate and Rhythm: Normal rate and regular rhythm.     Heart sounds: Murmur present.  Pulmonary:     Effort: Pulmonary effort is normal.     Breath sounds: Normal breath sounds. No wheezing or rales.  Abdominal:     General: Bowel sounds are normal. There is no distension.     Palpations: Abdomen is soft.     Tenderness: There is no abdominal tenderness.  Musculoskeletal:        General: No tenderness.     Cervical back: Normal range of motion and neck supple.     Lumbar back: No swelling, edema, spasms or bony tenderness. Normal range of motion. Negative right straight leg raise test and negative left straight leg raise test.     Right  lower leg: No edema.     Left lower leg: No edema.     Comments: No cva or bony tenderness Nl rom today  (states he has no pain right now)   Lymphadenopathy:     Cervical: No cervical adenopathy.  Skin:    General: Skin is warm and dry.     Coloration: Skin is not pale.     Findings: No erythema or rash.  Neurological:     Mental Status: He is alert.     Cranial Nerves: No cranial nerve deficit.     Sensory: No sensory deficit.     Motor: No atrophy or abnormal muscle tone.     Coordination: Coordination normal.     Deep Tendon Reflexes: Reflexes are normal and symmetric.  Comments: Negative SLR  Psychiatric:        Mood and Affect: Mood normal.           Assessment & Plan:   Problem List Items Addressed This Visit      Genitourinary   Nephrolithiasis - Primary    Today with R flank and back pain  (relieved by ibuprofen)     ua with rbc (standard for him with history)  KUB today -possible stone in R pelvis CT ordered       Relevant Orders   DG Abd 1 View (Completed)   Ambulatory referral to Urology   Microscopic hematuria    In the context of known kidney stone/also flank and back pain  KUB-possible stone in R pelvis so CT ordered  Urine cx       Relevant Orders   Urine Culture (Completed)     Other   Right low back pain    Unsure if all msk or if renal stone CT pending  Currently little to no pain after ibuprofen 600 mg       Relevant Orders   DG Abd 1 View (Completed)   Right flank pain    Flank and back pain  Per hx positional and responds completely to ibuprofen so not classic for renal stone  Nl exam - pt just took ibuprofen KUB-possible stone in R pelvis , CT ordered Urine cx sent        Relevant Orders   Urine Culture (Completed)   Ambulatory referral to Urology    Other Visit Diagnoses    Low back pain, unspecified back pain laterality, unspecified chronicity, unspecified whether sciatica present       Relevant Orders   POCT  Urinalysis Dipstick (Automated) (Completed)   Right lower quadrant abdominal pain       Relevant Orders   CT RENAL STONE STUDY (Completed)

## 2020-02-12 NOTE — Assessment & Plan Note (Addendum)
Unsure if all msk or if renal stone CT pending  Currently little to no pain after ibuprofen 600 mg

## 2020-02-12 NOTE — Patient Instructions (Addendum)
Continue ibuprofen 600 mg with a meal every 8 hours as needed  Use intermittent ice and heat  Stretching and walking are good / avoid heavy lifting for now   Drink lots of water   I'm waiting on the xray interpretation from radiology  We will contact you with a result and also to check in   If evidence of a passing stone - may consider a CT scan  Watch for visible in urine

## 2020-02-13 LAB — URINE CULTURE
MICRO NUMBER:: 10297146
Result:: NO GROWTH
SPECIMEN QUALITY:: ADEQUATE

## 2020-02-16 ENCOUNTER — Ambulatory Visit
Admission: RE | Admit: 2020-02-16 | Discharge: 2020-02-16 | Disposition: A | Discharge status: Home / Self Care | Payer: Commercial Managed Care - PPO | Source: Ambulatory Visit | Attending: Urology | Admitting: Urology

## 2020-02-16 ENCOUNTER — Other Ambulatory Visit: Payer: Self-pay

## 2020-02-16 ENCOUNTER — Ambulatory Visit: Payer: Commercial Managed Care - PPO | Admitting: Urology

## 2020-02-16 ENCOUNTER — Ambulatory Visit
Admission: RE | Admit: 2020-02-16 | Discharge: 2020-02-16 | Disposition: A | Discharge status: Home / Self Care | Payer: Commercial Managed Care - PPO | Attending: Urology | Admitting: Urology

## 2020-02-16 VITALS — BP 118/65 | HR 57 | Ht 71.0 in | Wt 180.0 lb

## 2020-02-16 DIAGNOSIS — N200 Calculus of kidney: Secondary | ICD-10-CM | POA: Diagnosis present

## 2020-02-16 DIAGNOSIS — N201 Calculus of ureter: Secondary | ICD-10-CM

## 2020-02-16 NOTE — Patient Instructions (Addendum)
Dietary Guidelines to Help Prevent Kidney Stones Kidney stones are deposits of minerals and salts that form inside your kidneys. Your risk of developing kidney stones may be greater depending on your diet, your lifestyle, the medicines you take, and whether you have certain medical conditions. Most people can reduce their chances of developing kidney stones by following the instructions below. Depending on your overall health and the type of kidney stones you tend to develop, your dietitian may give you more specific instructions. What are tips for following this plan? Reading food labels  Choose foods with "no salt added" or "low-salt" labels. Limit your sodium intake to less than 1500 mg per day.  Choose foods with calcium for each meal and snack. Try to eat about 300 mg of calcium at each meal. Foods that contain 200-500 mg of calcium per serving include: ? 8 oz (237 ml) of milk, fortified nondairy milk, and fortified fruit juice. ? 8 oz (237 ml) of kefir, yogurt, and soy yogurt. ? 4 oz (118 ml) of tofu. ? 1 oz of cheese. ? 1 cup (300 g) of dried figs. ? 1 cup (91 g) of cooked broccoli. ? 1-3 oz can of sardines or mackerel.  Most people need 1000 to 1500 mg of calcium each day. Talk to your dietitian about how much calcium is recommended for you. Shopping  Buy plenty of fresh fruits and vegetables. Most people do not need to avoid fruits and vegetables, even if they contain nutrients that may contribute to kidney stones.  When shopping for convenience foods, choose: ? Whole pieces of fruit. ? Premade salads with dressing on the side. ? Low-fat fruit and yogurt smoothies.  Avoid buying frozen meals or prepared deli foods.  Look for foods with live cultures, such as yogurt and kefir. Cooking  Do not add salt to food when cooking. Place a salt shaker on the table and allow each person to add his or her own salt to taste.  Use vegetable protein, such as beans, textured vegetable  protein (TVP), or tofu instead of meat in pasta, casseroles, and soups. Meal planning   Eat less salt, if told by your dietitian. To do this: ? Avoid eating processed or premade food. ? Avoid eating fast food.  Eat less animal protein, including cheese, meat, poultry, or fish, if told by your dietitian. To do this: ? Limit the number of times you have meat, poultry, fish, or cheese each week. Eat a diet free of meat at least 2 days a week. ? Eat only one serving each day of meat, poultry, fish, or seafood. ? When you prepare animal protein, cut pieces into small portion sizes. For most meat and fish, one serving is about the size of one deck of cards.  Eat at least 5 servings of fresh fruits and vegetables each day. To do this: ? Keep fruits and vegetables on hand for snacks. ? Eat 1 piece of fruit or a handful of berries with breakfast. ? Have a salad and fruit at lunch. ? Have two kinds of vegetables at dinner.  Limit foods that are high in a substance called oxalate. These include: ? Spinach. ? Rhubarb. ? Beets. ? Potato chips and french fries. ? Nuts.  If you regularly take a diuretic medicine, make sure to eat at least 1-2 fruits or vegetables high in potassium each day. These include: ? Avocado. ? Banana. ? Orange, prune, carrot, or tomato juice. ? Baked potato. ? Cabbage. ? Beans and split   peas. General instructions   Drink enough fluid to keep your urine clear or pale yellow. This is the most important thing you can do.  Talk to your health care provider and dietitian about taking daily supplements. Depending on your health and the cause of your kidney stones, you may be advised: ? Not to take supplements with vitamin C. ? To take a calcium supplement. ? To take a daily probiotic supplement. ? To take other supplements such as magnesium, fish oil, or vitamin B6.  Take all medicines and supplements as told by your health care provider.  Limit alcohol intake to no  more than 1 drink a day for nonpregnant women and 2 drinks a day for men. One drink equals 12 oz of beer, 5 oz of wine, or 1 oz of hard liquor.  Lose weight if told by your health care provider. Work with your dietitian to find strategies and an eating plan that works best for you. What foods are not recommended? Limit your intake of the following foods, or as told by your dietitian. Talk to your dietitian about specific foods you should avoid based on the type of kidney stones and your overall health. Grains Breads. Bagels. Rolls. Baked goods. Salted crackers. Cereal. Pasta. Vegetables Spinach. Rhubarb. Beets. Canned vegetables. [place]. Olives. Meats and other protein foods Nuts. Nut butters. Large portions of meat, poultry, or fish. Salted or cured meats. Deli meats. Hot dogs. Sausages. Dairy Cheese. Beverages Regular soft drinks. Regular vegetable juice. Seasonings and other foods Seasoning blends with salt. Salad dressings. Canned soups. Soy sauce. Ketchup. Barbecue sauce. Canned pasta sauce. Casseroles. Pizza. Lasagna. Frozen meals. Potato chips. [place] fries. Summary  You can reduce your risk of kidney stones by making changes to your diet.  The most important thing you can do is drink enough fluid. You should drink enough fluid to keep your urine clear or pale yellow.  Ask your health care provider or dietitian how much protein from animal sources you should eat each day, and also how much salt and calcium you should have each day. This information is not intended to replace advice given to you by your health care provider. Make sure you discuss any questions you have with your health care provider. Document Revised: 02/25/2019 Document Reviewed: 10/16/2016 Elsevier Patient Education  [employer].   Lithotripsy  Lithotripsy is a treatment that can sometimes help eliminate kidney stones and the pain that they cause. A form of lithotripsy, also known as extracorporeal  shock wave lithotripsy, is a nonsurgical procedure that crushes a kidney stone with shock waves. These shock waves pass through your body and focus on the kidney stone. They cause the kidney stone to break up while it is still in the urinary tract. This makes it easier for the smaller pieces of stone to pass in the urine. Tell a health care provider about:  Any allergies you have.  All medicines you are taking, including vitamins, herbs, eye drops, creams, and over-the-counter medicines.  Any blood disorders you have.  Any surgeries you have had.  Any medical conditions you have.  Whether you are pregnant or may be pregnant.  Any problems you or family members have had with anesthetic medicines. What are the risks? Generally, this is a safe procedure. However, problems may occur, including:  Infection.  Bleeding of the kidney.  Bruising of the kidney or skin.  Scarring of the kidney, which can lead to: ? Increased blood pressure. ? Poor kidney function. ?  Return (recurrence) of kidney stones.  Damage to other structures or organs, such as the liver, colon, spleen, or pancreas.  Blockage (obstruction) of the the tube that carries urine from the kidney to the bladder (ureter).  Failure of the kidney stone to break into pieces (fragments). What happens before the procedure? Staying hydrated Follow instructions from your health care provider about hydration, which may include:  Up to 2 hours before the procedure - you may continue to drink clear liquids, such as water, clear fruit juice, black coffee, and plain tea. Eating and drinking restrictions Follow instructions from your health care provider about eating and drinking, which may include:  8 hours before the procedure - stop eating heavy meals or foods such as meat, fried foods, or fatty foods.  6 hours before the procedure - stop eating light meals or foods, such as toast or cereal.  6 hours before the procedure -  stop drinking milk or drinks that contain milk.  2 hours before the procedure - stop drinking clear liquids. General instructions  Plan to have someone take you home from the hospital or clinic.  Ask your health care provider about: ? Changing or stopping your regular medicines. This is especially important if you are taking diabetes medicines or blood thinners. ? Taking medicines such as aspirin and ibuprofen. These medicines and other NSAIDs can thin your blood. Do not take these medicines for 7 days before your procedure if your health care provider instructs you not to.  You may have tests, such as: ? Blood tests. ? Urine tests. ? Imaging tests, such as a CT scan. What happens during the procedure?  To lower your risk of infection: ? Your health care team will wash or sanitize their hands. ? Your skin will be washed with soap.  An IV tube will be inserted into one of your veins. This tube will give you fluids and medicines.  You will be given one or more of the following: ? A medicine to help you relax (sedative). ? A medicine to make you fall asleep (general anesthetic).  A water-filled cushion may be placed behind your kidney or on your abdomen. In some cases you may be placed in a tub of lukewarm water.  Your body will be positioned in a way that makes it easy to target the kidney stone.  A flexible tube with holes in it (stent) may be placed in the ureter. This will help keep urine flowing from the kidney if the fragments of the stone have been blocking the ureter.  An X-ray or ultrasound exam will be done to locate your stone.  Shock waves will be aimed at the stone. If you are awake, you may feel a tapping sensation as the shock waves pass through your body. The procedure may vary among health care providers and hospitals. What happens after the procedure?  You may have an X-ray to see whether the procedure was able to break up the kidney stone and how much of the  stone has passed. If large stone fragments remain after treatment, you may need to have a second procedure at a later time.  Your blood pressure, heart rate, breathing rate, and blood oxygen level will be monitored until the medicines you were given have worn off.  You may be given antibiotics or pain medicine as needed.  If a stent was placed in your ureter during surgery, it may stay in place for a few weeks.  You may need strain  your urine to collect pieces of the kidney stone for testing.  You will need to drink plenty of water.  Do not drive for 24 hours if you were given a sedative. Summary  Lithotripsy is a treatment that can sometimes help eliminate kidney stones and the pain that they cause.  A form of lithotripsy, also known as extracorporeal shock wave lithotripsy, is a nonsurgical procedure that crushes a kidney stone with shock waves.  Generally, this is a safe procedure. However, problems may occur, including damage to the kidney or other organs, infection, or obstruction of the tube that carries urine from the kidney to the bladder (ureter).  When you go home, you will need to drink plenty of water. You may be asked to strain your urine to collect pieces of the kidney stone for testing. This information is not intended to replace advice given to you by your health care provider. Make sure you discuss any questions you have with your health care provider. Document Revised: 02/16/2019 Document Reviewed: 09/26/2016 Elsevier Patient Education  2020 [place] for Kidney Stones Laser therapy for kidney stones is a procedure to break up small, hard mineral deposits that form in the kidney (kidney stones). The procedure is done using a device that produces a focused beam of light (laser). The laser breaks up kidney stones into pieces that are small enough to be passed out of the body through urination or removed from the body during the procedure. You may need  laser therapy if you have kidney stones that are painful or block your urinary tract. This procedure is done by inserting a tube (ureteroscope) into your kidney through the urethral opening. The urethra is the part of the body that drains urine from the bladder. In women, the urethra opens above the vaginal opening. In men, the urethra opens at the tip of the penis. The ureteroscope is inserted through the urethra, and surgical instruments are moved through the bladder and the muscular tube that connects the kidney to the bladder (ureter) until they reach the kidney. Tell a health care provider about:  Any allergies you have.  All medicines you are taking, including vitamins, herbs, eye drops, creams, and over-the-counter medicines.  Any problems you or family members have had with anesthetic medicines.  Any blood disorders you have.  Any surgeries you have had.  Any medical conditions you have.  Whether you are pregnant or may be pregnant. What are the risks? Generally, this is a safe procedure. However, problems may occur, including:  Infection.  Bleeding.  Allergic reactions to medicines.  Damage to the urethra, bladder, or ureter.  Urinary tract infection (UTI).  Narrowing of the urethra (urethral stricture).  Difficulty passing urine.  Blockage of the kidney caused by a fragment of kidney stone. What happens before the procedure? Medicines  Ask your health care provider about: ? Changing or stopping your regular medicines. This is especially important if you are taking diabetes medicines or blood thinners. ? Taking medicines such as aspirin and ibuprofen. These medicines can thin your blood. Do not take these medicines unless your health care provider tells you to take them. ? Taking over-the-counter medicines, vitamins, herbs, and supplements. Eating and drinking Follow instructions from your health care provider about eating and drinking, which may include:  8  hours before the procedure - stop eating heavy meals or foods, such as meat, fried foods, or fatty foods.  6 hours before the procedure - stop eating  light meals or foods, such as toast or cereal.  6 hours before the procedure - stop drinking milk or drinks that contain milk.  2 hours before the procedure - stop drinking clear liquids. Staying hydrated Follow instructions from your health care provider about hydration, which may include:  Up to 2 hours before the procedure - you may continue to drink clear liquids, such as water, clear fruit juice, black coffee, and plain tea.  General instructions  You may have a physical exam before the procedure. You may also have tests, such as imaging tests and blood or urine tests.  If your ureter is too narrow, your health care provider may place a soft, flexible tube (stent) inside of it. The stent may be placed days or weeks before your laser therapy procedure.  Plan to have someone take you home from the hospital or clinic.  If you will be going home right after the procedure, plan to have someone stay with you for 24 hours.  Do not use any products that contain nicotine or tobacco for at least 4 weeks before the procedure. These products include cigarettes, e-cigarettes, and chewing tobacco. If you need help quitting, ask your health care provider.  Ask your health care provider: ? How your surgical site will be marked or identified. ? What steps will be taken to help prevent infection. These may include:  Removing hair at the surgery site.  Washing skin with a germ-killing soap.  Taking antibiotic medicine. What happens during the procedure?   An IV will be inserted into one of your veins.  You will be given one or more of the following: ? A medicine to help you relax (sedative). ? A medicine to numb the area (local anesthetic). ? A medicine to make you fall asleep (general anesthetic).  A ureteroscope will be inserted into your  urethra. The ureteroscope will send images to a video screen in the operating room to guide your surgeon to the area of your kidney that will be treated.  A small, flexible tube will be threaded through the ureteroscope and into your bladder and ureter, up to your kidney.  The laser device will be inserted into your kidney through the tube. Your surgeon will pulse the laser on and off to break up kidney stones.  A surgical instrument that has a tiny wire basket may be inserted through the tube into your kidney to remove the pieces of broken kidney stone. The procedure may vary among health care providers and hospitals. What happens after the procedure?  Your blood pressure, heart rate, breathing rate, and blood oxygen level will be monitored until you leave the hospital or clinic.  You will be given pain medicine as needed.  You may continue to receive antibiotics.  You may have a stent temporarily placed in your ureter.  Do not drive for 24 hours if you were given a sedative during your procedure.  You may be given a strainer to collect any stone fragments that you pass in your urine. Your health care provider may have these tested. Summary  Laser therapy for kidney stones is a procedure to break up kidney stones into pieces that are small enough to be passed out of the body through urination or removed during the procedure.  Follow instructions from your health care provider about eating and drinking before the procedure.  During the procedure, the ureteroscope will send images to a video screen to guide your surgeon to the area of your  kidney that will be treated.  Do not drive for 24 hours if you were given a sedative during your procedure. This information is not intended to replace advice given to you by your health care provider. Make sure you discuss any questions you have with your health care provider. Document Revised: 07/17/2018 Document Reviewed: 07/17/2018 Elsevier  Patient Education  [employer].   Ureteral Stent Implantation  Ureteral stent implantation is a procedure to insert (implant) a flexible, soft, plastic tube (stent) into a ureter. Ureters are the tube-like parts of the body that drain urine from the kidneys. The stent supports the ureter while it heals and helps to drain urine. You may have a ureteral stent implanted after having a procedure to remove a blockage from the ureter (ureterolysis or pyeloplasty). You may also have a stent implanted to open the flow of urine when you have a blockage caused by a kidney stone, tumor, blood clot, or infection. You have two ureters, one on each side of the body. The ureters connect the kidneys to the organ that holds urine until it passes out of the body (bladder). The stent is placed so that one end is in the kidney, and one end is in the bladder. The stent is usually taken out after your ureter has healed. Depending on your condition, you may have a stent for just a few weeks, or you may have a long-term stent that will need to be replaced every few months. Tell a health care provider about:  Any allergies you have.  All medicines you are taking, including vitamins, herbs, eye drops, creams, and over-the-counter medicines.  Any problems you or family members have had with anesthetic medicines.  Any blood disorders you have.  Any surgeries you have had.  Any medical conditions you have.  Whether you are pregnant or may be pregnant. What are the risks? Generally, this is a safe procedure. However, problems may occur, including:  Infection.  Bleeding.  Allergic reactions to medicines.  Damage to other structures or organs. Tearing (perforation) of the ureter is possible.  Movement of the stent away from where it is placed during surgery (migration). What happens before the procedure? Medicines Ask your health care provider about:  Changing or stopping your regular medicines. This is  especially important if you are taking diabetes medicines or blood thinners.  Taking medicines such as aspirin and ibuprofen. These medicines can thin your blood. Do not take these medicines unless your health care provider tells you to take them.  Taking over-the-counter medicines, vitamins, herbs, and supplements. Eating and drinking Follow instructions from your health care provider about eating and drinking, which may include:  8 hours before the procedure - stop eating heavy meals or foods, such as meat, fried foods, or fatty foods.  6 hours before the procedure - stop eating light meals or foods, such as toast or cereal.  6 hours before the procedure - stop drinking milk or drinks that contain milk.  2 hours before the procedure - stop drinking clear liquids. Staying hydrated Follow instructions from your health care provider about hydration, which may include:  Up to 2 hours before the procedure - you may continue to drink clear liquids, such as water, clear fruit juice, black coffee, and plain tea. General instructions  Do not drink alcohol.  Do not use any products that contain nicotine or tobacco for at least 4 weeks before the procedure. These products include cigarettes, e-cigarettes, and chewing tobacco. If you  need help quitting, ask your health care provider.  You may have an exam or testing, such as imaging or blood tests.  Ask your health care provider what steps will be taken to help prevent infection. These may include: ? Removing hair at the surgery site. ? Washing skin with a germ-killing soap. ? Taking antibiotic medicine.  Plan to have someone take you home from the hospital or clinic.  If you will be going home right after the procedure, plan to have someone with you for 24 hours. What happens during the procedure?  An IV will be inserted into one of your veins.  You may be given a medicine to help you relax (sedative).  You may be given a medicine to  make you fall asleep (general anesthetic).  A thin, tube-shaped instrument with a light and tiny camera at the end (cystoscope) will be inserted into your urethra. The urethra is the tube that drains urine from the bladder out of the body. In men, the urethra opens at the end of the penis. In women, the urethra opens in front of the vaginal opening.  The cystoscope will be passed into your bladder.  A thin wire (guide wire) will be passed through your bladder and into your ureter. This is used to guide the stent into your ureter.  The stent will be inserted into your ureter.  The guide wire and the cystoscope will be removed.  A flexible tube (catheter) may be inserted through your urethra so that one end is in your bladder. This helps to drain urine from your bladder. The procedure may vary among hospitals and health care providers. What happens after the procedure?  Your blood pressure, heart rate, breathing rate, and blood oxygen level will be monitored until you leave the hospital or clinic.  You may continue to receive medicine and fluids through an IV.  You may have some soreness or pain in your abdomen and urethra. Medicines will be available to help you.  You will be encouraged to get up and walk around as soon as you can.  You may have a catheter draining your urine.  You will have some blood in your urine.  Do not drive for 24 hours if you were given a sedative during your procedure. Summary  Ureteral stent implantation is a procedure to insert a flexible, soft, plastic tube (stent) into a ureter.  You may have a stent implanted to support the ureter while it heals after a procedure or to open the flow of urine if there is a blockage.  Follow instructions from your health care provider about taking medicines and about eating and drinking before the procedure.  Depending on your condition, you may have a stent for just a few weeks, or you may have a long-term stent that  will need to be replaced every few months. This information is not intended to replace advice given to you by your health care provider. Make sure you discuss any questions you have with your health care provider. Document Revised: 08/12/2018 Document Reviewed: 08/13/2018 Elsevier Patient Education  2020 [employer].

## 2020-02-16 NOTE — Progress Notes (Signed)
   02/16/2020 10:55 AM   Jay Price. June 21, 1976 [id number]  Reason for visit: Right ureteral stone  HPI: I saw Jay Price as an add-on in urology clinic for a right distal ureteral stone.  He was previously followed by Dr. Bernardo Heater for history of gross hematuria.  He denies any prior episodes of nephrolithiasis.  He presented to his PCP with right-sided groin pain on 02/12/2020, and KUB suggested a 4 mm right distal ureteral stone.  A follow-up CT performed later that day showed migration of the stone to the UVJ with mild upstream hydroureteronephrosis.  Urine culture was negative.  He reports his pain has been very well controlled with ibuprofen and denies any complaints today.  He denies any fevers or chills.  He thinks he still had some pink urine over the last few days, but has not had any significant flank pain.  He consumes spinach daily with spinach smoothies with almond milk.  We discussed various treatment options for urolithiasis including observation with or without medical expulsive therapy, shockwave lithotripsy (SWL), and ureteroscopy and laser lithotripsy with stent placement.  We discussed that management is based on stone size, location, density, patient co-morbidities, and patient preference.   Stones <30mm in size have a >80% spontaneous passage rate. Data surrounding the use of tamsulosin for medical expulsive therapy is controversial, but meta analyses suggests it is most efficacious for distal stones between 5-16mm in size. Possible side effects include dizziness/lightheadedness, and retrograde ejaculation.  SWL has a lower stone free rate in a single procedure, but also a lower complication rate compared to ureteroscopy and avoids a stent and associated stent related symptoms. Possible complications include renal hematoma, steinstrasse, and need for additional treatment.  Ureteroscopy with laser lithotripsy and stent placement has a higher stone free rate than SWL in  a single procedure, however increased complication rate including possible infection, ureteral injury, bleeding, and stent related morbidity. Common stent related symptoms include dysuria, urgency/frequency, and flank pain.  After an extensive discussion of the risks and benefits of the above treatment options, the patient would like to proceed with medical expulsive therapy.  We discussed general stone prevention strategies including adequate hydration with goal of producing 2.5 L of urine daily, increasing citric acid intake, increasing calcium intake during high oxalate meals, minimizing animal protein, and decreasing salt intake. Information about dietary recommendations given today.   -KUB today to evaluate for persistent stone, call with results -Continue medical expulsive therapy if stone present and RTC in 10 to 14 days with repeat KUB.  Consider surgical intervention if still present at that time, or if refractory pain.  He would be a good candidate for SWL or URS.  Billey Co, [facility] [address], [place], [place] [address] [phone]

## 2020-02-17 ENCOUNTER — Telehealth: Payer: Self-pay

## 2020-02-17 NOTE — Telephone Encounter (Signed)
mychart notification sent 

## 2020-02-17 NOTE — Telephone Encounter (Signed)
-----   Message from Billey Co, MD sent at 02/17/2020 11:45 AM EDT ----- Joaquim Lai not seen on KUB, but significant constipation making visualization difficult. Consider miralax/docusate x 1 week.   Please set up virtual visit 3 weeks to check in and make sure no residual stone symptoms.  Nickolas Madrid, MD 02/17/2020

## 2020-02-24 NOTE — Telephone Encounter (Signed)
Dr Glori Bickers had visit with pt on 02/12/20.

## 2020-02-24 NOTE — Telephone Encounter (Signed)
Left pt message to call, mychart not read

## 2020-02-26 NOTE — Telephone Encounter (Signed)
Spoke with patient , previously addressed and apt made

## 2020-02-26 NOTE — Telephone Encounter (Signed)
Left pt message to call °

## 2020-03-23 ENCOUNTER — Encounter: Payer: Self-pay | Admitting: Urology

## 2020-03-23 ENCOUNTER — Ambulatory Visit (INDEPENDENT_AMBULATORY_CARE_PROVIDER_SITE_OTHER): Payer: Commercial Managed Care - PPO | Admitting: Urology

## 2020-03-23 ENCOUNTER — Other Ambulatory Visit: Payer: Self-pay

## 2020-03-23 VITALS — BP 127/69 | HR 49 | Ht 71.0 in | Wt 180.0 lb

## 2020-03-23 DIAGNOSIS — N200 Calculus of kidney: Secondary | ICD-10-CM | POA: Diagnosis not present

## 2020-03-23 NOTE — Progress Notes (Signed)
kub

## 2020-03-23 NOTE — Progress Notes (Signed)
   03/23/2020 4:06 PM   Jay Price. 25-Apr-1976 [id number]  Reason for visit: Follow up right distal ureteral stone  HPI: I saw Jay Price back in urology clinic for follow-up of a 4 mm right distal ureteral stone.  This was originally seen on a CT on 02/12/2020.  He has not had any flank pain or dysuria, however he continues to have bothersome urinary frequency and occasional urgency.  A follow-up KUB on 02/16/2020 did not show any definite stones, however there was significant constipation and unclear if the stone was still present.  We reviewed options at length today including observation, repeat imaging with either KUB, renal ultrasound, or CT.  He opts for a repeat KUB today to see if the stone is still present.  I will call with results tomorrow.  We also discussed behavioral strategies for his urinary frequency and urgency.  KUB today, call with results  I spent 20 total minutes on the day of the encounter including pre-visit review of the medical record, face-to-face time with the patient, and post visit ordering of labs/imaging/tests.  Jay Price, [facility] [address], [place], [place] [address] [phone]

## 2021-02-27 ENCOUNTER — Encounter: Payer: Self-pay | Admitting: Family Medicine

## 2021-02-28 ENCOUNTER — Other Ambulatory Visit (INDEPENDENT_AMBULATORY_CARE_PROVIDER_SITE_OTHER): Payer: Commercial Managed Care - PPO

## 2021-02-28 ENCOUNTER — Other Ambulatory Visit: Payer: Self-pay

## 2021-02-28 DIAGNOSIS — Z125 Encounter for screening for malignant neoplasm of prostate: Secondary | ICD-10-CM

## 2021-02-28 DIAGNOSIS — Z13220 Encounter for screening for lipoid disorders: Secondary | ICD-10-CM

## 2021-02-28 DIAGNOSIS — Z79899 Other long term (current) drug therapy: Secondary | ICD-10-CM

## 2021-02-28 DIAGNOSIS — Z131 Encounter for screening for diabetes mellitus: Secondary | ICD-10-CM | POA: Diagnosis not present

## 2021-02-28 LAB — HEPATIC FUNCTION PANEL
ALT: 22 U/L (ref 0–53)
AST: 19 U/L (ref 0–37)
Albumin: 4.1 g/dL (ref 3.5–5.2)
Alkaline Phosphatase: 78 U/L (ref 39–117)
Bilirubin, Direct: 0.1 mg/dL (ref 0.0–0.3)
Total Bilirubin: 0.5 mg/dL (ref 0.2–1.2)
Total Protein: 7.2 g/dL (ref 6.0–8.3)

## 2021-02-28 LAB — LIPID PANEL
Cholesterol: 136 mg/dL (ref 0–200)
HDL: 38.2 mg/dL — ABNORMAL LOW (ref >39.00)
LDL Cholesterol: 85 mg/dL (ref 0–99)
NonHDL: 98.23
Total CHOL/HDL Ratio: 4
Triglycerides: 64 mg/dL (ref 0.0–149.0)
VLDL: 12.8 mg/dL (ref 0.0–40.0)

## 2021-02-28 LAB — CBC WITH DIFFERENTIAL/PLATELET
Basophils Absolute: 0 10*3/uL (ref 0.0–0.1)
Basophils Relative: 0.7 % (ref 0.0–3.0)
Eosinophils Absolute: 0.2 10*3/uL (ref 0.0–0.7)
Eosinophils Relative: 3 % (ref 0.0–5.0)
HCT: 41.3 % (ref 39.0–52.0)
Hemoglobin: 14 g/dL (ref 13.0–17.0)
Lymphocytes Relative: 34.8 % (ref 12.0–46.0)
Lymphs Abs: 2.4 10*3/uL (ref 0.7–4.0)
MCHC: 33.8 g/dL (ref 30.0–36.0)
MCV: 89.4 fl (ref 78.0–100.0)
Monocytes Absolute: 0.6 10*3/uL (ref 0.1–1.0)
Monocytes Relative: 9.2 % (ref 3.0–12.0)
Neutro Abs: 3.6 10*3/uL (ref 1.4–7.7)
Neutrophils Relative %: 52.3 % (ref 43.0–77.0)
Platelets: 329 10*3/uL (ref 150.0–400.0)
RBC: 4.62 Mil/uL (ref 4.22–5.81)
RDW: 12.8 % (ref 11.5–15.5)
WBC: 6.8 10*3/uL (ref 4.0–10.5)

## 2021-02-28 LAB — BASIC METABOLIC PANEL
BUN: 12 mg/dL (ref 6–23)
CO2: 30 mEq/L (ref 19–32)
Calcium: 10 mg/dL (ref 8.4–10.5)
Chloride: 104 mEq/L (ref 96–112)
Creatinine, Ser: 1.04 mg/dL (ref 0.40–1.50)
GFR: 87.49 mL/min (ref >60.00)
Glucose, Bld: 90 mg/dL (ref 70–99)
Potassium: 5 mEq/L (ref 3.5–5.1)
Sodium: 142 mEq/L (ref 135–145)

## 2021-02-28 LAB — HEMOGLOBIN A1C: Hgb A1c MFr Bld: 5.9 % (ref 4.6–6.5)

## 2021-03-01 LAB — PSA, TOTAL WITH REFLEX TO PSA, FREE: PSA, Total: 1.4 ng/mL (ref <=4.0)

## 2021-03-09 ENCOUNTER — Encounter: Payer: Commercial Managed Care - PPO | Admitting: Family Medicine

## 2021-03-22 NOTE — Progress Notes (Signed)
Avantae Bither T. Ameshia Pewitt, MD, [place] at [facility] [place], [address]  Phone: [phone]  FAX: [place]. - 45 y.o. male  MRN [id number]  Date of Birth: Jul 02, 1976  Date: 03/23/2021  PCP: Owens Loffler, MD  Referral: Owens Loffler, MD  Chief Complaint  Patient presents with  . Annual Exam    This visit occurred during the SARS-CoV-2 public health emergency.  Safety protocols were in place, including screening questions prior to the visit, additional usage of staff PPE, and extensive cleaning of exam room while observing appropriate contact time as indicated for disinfecting solutions.   Patient Care Team: Owens Loffler, MD as PCP - General (Family Medicine) Subjective:   Jay Price. is a 45 y.o. pleasant patient who presents with the following:  Preventative Health Maintenance Visit:  Health Maintenance Summary Reviewed and updated, unless pt declines services.  Tobacco History Reviewed. Alcohol: No concerns, no excessive use Exercise Habits: not all that much right now STD concerns: no risk or activity to increase risk Drug Use: None  Has been urinating a lot. ? If relates to kidney stone.  15-20 times in a day No pain or blood  Muscle twitches at night Always at night  Goes to the gym a few times a week.  Feinted at work once a number of months ago Had minimal sleeping  He is working 70+ hours a week  Health Maintenance  Topic Date Due  . COVID-19 Vaccine (3 - Booster for Pfizer series) 03/03/2021  . INFLUENZA VACCINE  06/19/2021  . TETANUS/TDAP  01/09/2027  . Hepatitis C Screening  Completed  . HIV Screening  Completed  . HPV VACCINES  Aged Out   Immunization History  Administered Date(s) Administered  . PFIZER Comirnaty(Gray Top)Covid-19 Tri-Sucrose Vaccine 08/12/2020, 09/02/2020  . Tdap 01/09/2017   Patient  Active Problem List   Diagnosis Date Noted  . Nephrolithiasis 02/05/2018    Past Medical History:  Diagnosis Date  . Kidney stone   . Seizures ([place])    Distantly, had about 5 seizures, usually associated with great fatigue and strain. Last 10 years ago. Tonic-clonic activity described. Post-ictal state.    History reviewed. No pertinent surgical history.  Family History  Problem Relation Age of Onset  . Prostate cancer Neg Hx   . Bladder Cancer Neg Hx   . Kidney Stones Neg Hx     Past Medical History, Surgical History, Social History, Family History, Problem List, Medications, and Allergies have been reviewed and updated if relevant.  Review of Systems: Pertinent positives are listed above.  Otherwise, a full 14 point review of systems has been done in full and it is negative except where it is noted positive.  Objective:   BP 106/74   Pulse (!) 59   Temp 98.2 F (36.8 C) (Temporal)   Ht 5\' 11"  (1.803 m)   Wt 172 lb (78 kg)   SpO2 95%   BMI 23.99 kg/m  Ideal Body Weight: Weight in (lb) to have BMI = 25: 178.9  Ideal Body Weight: Weight in (lb) to have BMI = 25: 178.9 No exam data present Depression screen [facility] 2/9 03/23/2021 03/23/2021 04/29/2019 01/20/2018  Decreased Interest 0 0 0 0  Down, Depressed, Hopeless 0 0 0 0  PHQ - 2 Score 0 0 0 0  Altered sleeping 1 - - -  Tired, decreased energy 1 - - -  Change in appetite 0 - - -  Feeling bad or failure about yourself  0 - - -  Trouble concentrating 1 - - -  Moving slowly or fidgety/restless 0 - - -  Suicidal thoughts 0 - - -  PHQ-9 Score 3 - - -  Difficult doing work/chores Not difficult at all - - -     GEN: well developed, well nourished, no acute distress Eyes: conjunctiva and lids normal, PERRLA, EOMI ENT: TM clear, nares clear, oral exam WNL Neck: supple, no lymphadenopathy, no thyromegaly, no JVD Pulm: clear to auscultation and percussion, respiratory effort normal CV: regular rate and rhythm, S1-S2, no  murmur, rub or gallop, no bruits, peripheral pulses normal and symmetric, no cyanosis, clubbing, edema or varicosities GI: soft, non-tender; no hepatosplenomegaly, masses; active bowel sounds all quadrants GU: deferred Lymph: no cervical, axillary or inguinal adenopathy MSK: gait normal, muscle tone and strength WNL, no joint swelling, effusions, discoloration, crepitus  SKIN: clear, good turgor, color WNL, no rashes, lesions, or ulcerations Neuro: normal mental status, normal strength, sensation, and motion Psych: alert; oriented to person, place and time, normally interactive and not anxious or depressed in appearance.  All labs reviewed with patient. Results for orders placed or performed in visit on 02/28/21  Lipid panel  Result Value Ref Range   Cholesterol 136 0 - 200 mg/dL   Triglycerides 64.0 0.0 - 149.0 mg/dL   HDL 38.20 (L) >39.00 mg/dL   VLDL 12.8 0.0 - 40.0 mg/dL   LDL Cholesterol 85 0 - 99 mg/dL   Total CHOL/HDL Ratio 4    NonHDL 98.23   Hepatic function panel  Result Value Ref Range   Total Bilirubin 0.5 0.2 - 1.2 mg/dL   Bilirubin, Direct 0.1 0.0 - 0.3 mg/dL   Alkaline Phosphatase 78 39 - 117 U/L   AST 19 0 - 37 U/L   ALT 22 0 - 53 U/L   Total Protein 7.2 6.0 - 8.3 g/dL   Albumin 4.1 3.5 - 5.2 g/dL  Basic metabolic panel  Result Value Ref Range   Sodium 142 135 - 145 mEq/L   Potassium 5.0 3.5 - 5.1 mEq/L   Chloride 104 96 - 112 mEq/L   CO2 30 19 - 32 mEq/L   Glucose, Bld 90 70 - 99 mg/dL   BUN 12 6 - 23 mg/dL   Creatinine, Ser 1.04 0.40 - 1.50 mg/dL   GFR 87.49 >60.00 mL/min   Calcium 10.0 8.4 - 10.5 mg/dL  Hemoglobin A1c  Result Value Ref Range   Hgb A1c MFr Bld 5.9 4.6 - 6.5 %  PSA, Total with Reflex to PSA, Free  Result Value Ref Range   PSA, Total 1.4 < OR = 4.0 ng/mL  CBC with Differential/Platelet  Result Value Ref Range   WBC 6.8 4.0 - 10.5 K/uL   RBC 4.62 4.22 - 5.81 Mil/uL   Hemoglobin 14.0 13.0 - 17.0 g/dL   HCT 41.3 39.0 - 52.0 %   MCV  89.4 78.0 - 100.0 fl   MCHC 33.8 30.0 - 36.0 g/dL   RDW 12.8 11.5 - 15.5 %   Platelets 329.0 150.0 - 400.0 K/uL   Neutrophils Relative % 52.3 43.0 - 77.0 %   Lymphocytes Relative 34.8 12.0 - 46.0 %   Monocytes Relative 9.2 3.0 - 12.0 %   Eosinophils Relative 3.0 0.0 - 5.0 %   Basophils Relative 0.7 0.0 - 3.0 %   Neutro Abs 3.6 1.4 - 7.7 K/uL  Lymphs Abs 2.4 0.7 - 4.0 K/uL   Monocytes Absolute 0.6 0.1 - 1.0 K/uL   Eosinophils Absolute 0.2 0.0 - 0.7 K/uL   Basophils Absolute 0.0 0.0 - 0.1 K/uL    Assessment and Plan:     ICD-10-CM   1. Healthcare maintenance  Z00.00   2. Right renal stone  N20.0 US RENAL  3. Urinary frequency  R35.0 US RENAL   Some challenges, and I think a lot of this is coming from not enough sleep working 70 hours a week.  Urinating 20 times a day.  He has tried fluid restriction.  Will check renal US.  Health Maintenance Exam: The patient's preventative maintenance and recommended screening tests for an annual wellness exam were reviewed in full today. Brought up to date unless services declined.  Counselled on the importance of diet, exercise, and its role in overall health and mortality. The patient's FH and SH was reviewed, including their home life, tobacco status, and drug and alcohol status.  Follow-up in 1 year for physical exam or additional follow-up below.  Follow-up: Return in about 1 year (around 03/23/2022). Or follow-up in 1 year if not noted.  No orders of the defined types were placed in this encounter.  Medications Discontinued During This Encounter  Medication Reason  . tamsulosin (FLOMAX) 0.4 MG CAPS capsule Error   Orders Placed This Encounter  Procedures  . US RENAL    Signed,  Frederico Hamman T. Tinzlee Craker, MD   Allergies as of 03/23/2021   No Known Allergies     Medication List       Accurate as of Mar 23, 2021 11:59 PM. If you have any questions, ask your nurse or doctor.        STOP taking these medications   tamsulosin  0.4 MG Caps capsule Commonly known as: FLOMAX Stopped by: Owens Loffler, MD

## 2021-03-23 ENCOUNTER — Telehealth: Payer: Self-pay

## 2021-03-23 ENCOUNTER — Encounter: Payer: Self-pay | Admitting: Family Medicine

## 2021-03-23 ENCOUNTER — Other Ambulatory Visit: Payer: Self-pay | Admitting: Family Medicine

## 2021-03-23 ENCOUNTER — Other Ambulatory Visit: Payer: Self-pay

## 2021-03-23 ENCOUNTER — Ambulatory Visit (INDEPENDENT_AMBULATORY_CARE_PROVIDER_SITE_OTHER): Payer: Commercial Managed Care - PPO | Admitting: Family Medicine

## 2021-03-23 VITALS — BP 106/74 | HR 59 | Temp 98.2°F | Ht 71.0 in | Wt 172.0 lb

## 2021-03-23 DIAGNOSIS — N200 Calculus of kidney: Secondary | ICD-10-CM | POA: Diagnosis not present

## 2021-03-23 DIAGNOSIS — R350 Frequency of micturition: Secondary | ICD-10-CM

## 2021-03-23 DIAGNOSIS — Z113 Encounter for screening for infections with a predominantly sexual mode of transmission: Secondary | ICD-10-CM

## 2021-03-23 DIAGNOSIS — Z0000 Encounter for general adult medical examination without abnormal findings: Secondary | ICD-10-CM

## 2021-03-23 NOTE — Telephone Encounter (Signed)
Can you help set this up.  I generally do not do STD screening on all people unless they have symptoms, so I apologize if this was not done.  Urine: chlamydia, gonorrhea, trichomonas:  dx;  increased risk for STD, exposure to bodily fluid Blood:  HIV, RPR: increased risk for STD and exposure to bodily fluid

## 2021-03-23 NOTE — Telephone Encounter (Signed)
Patient forgot to ask to have STD screening lab. Patient states he usually has this done with his routine CPE labs just for annual screening and this was not ordered this time. Last time it was checked was 2020. Patient would like to have this done. Not sure if it needs to be lab or UA. Please let patient know. Not symptomatic.

## 2021-03-24 ENCOUNTER — Other Ambulatory Visit (INDEPENDENT_AMBULATORY_CARE_PROVIDER_SITE_OTHER): Payer: Commercial Managed Care - PPO

## 2021-03-24 ENCOUNTER — Other Ambulatory Visit: Payer: Self-pay

## 2021-03-24 ENCOUNTER — Encounter: Payer: Self-pay | Admitting: Family Medicine

## 2021-03-24 DIAGNOSIS — Z113 Encounter for screening for infections with a predominantly sexual mode of transmission: Secondary | ICD-10-CM | POA: Diagnosis not present

## 2021-03-27 LAB — C. TRACHOMATIS/N. GONORRHOEAE RNA
C. trachomatis RNA, TMA: NOT DETECTED
N. gonorrhoeae RNA, TMA: NOT DETECTED

## 2021-03-27 LAB — RPR: RPR Ser Ql: NONREACTIVE

## 2021-03-27 LAB — HIV ANTIBODY (ROUTINE TESTING W REFLEX): HIV 1&2 Ab, 4th Generation: NONREACTIVE

## 2021-03-27 LAB — TRICHOMONAS VAGINALIS RNA, QL,MALES: Trichomonas vaginalis RNA: NOT DETECTED

## 2021-05-02 ENCOUNTER — Other Ambulatory Visit: Payer: Self-pay

## 2021-05-02 ENCOUNTER — Ambulatory Visit
Admission: RE | Admit: 2021-05-02 | Discharge: 2021-05-02 | Disposition: A | Discharge status: Home / Self Care | Payer: Commercial Managed Care - PPO | Source: Ambulatory Visit | Attending: Family Medicine | Admitting: Family Medicine

## 2021-05-02 DIAGNOSIS — N200 Calculus of kidney: Secondary | ICD-10-CM | POA: Insufficient documentation

## 2021-05-02 DIAGNOSIS — R350 Frequency of micturition: Secondary | ICD-10-CM | POA: Insufficient documentation

## 2021-09-04 ENCOUNTER — Telehealth: Payer: Self-pay

## 2021-09-04 NOTE — Telephone Encounter (Signed)
Ideally, he could see me, but I understand transportation issues.

## 2021-09-04 NOTE — Telephone Encounter (Signed)
I spoke to the pt and he preferred not to wait until appt that was already scheduled for 10/24/22and due to location of offices preferred to be seen at [facility]; pt will be going out ot town on 09/07/21 and would like to be seen prior to that if possible. Pt scheduled appt with Dr Silvio Pate on 09/05/21 at 8:45. UC & ED precautions given and pt voiced understanding.sending note to Dr Silvio Pate, Dr Lorelei Pont as PCP and Larene Beach CMA.

## 2021-09-04 NOTE — Telephone Encounter (Signed)
[facility] - Client TELEPHONE ADVICE RECORD [facility] Patient Name: Nkosi RITC HIE Gender: Male DOB: Aug 17, 1976 Age: 45 Y 70 M 7 D Return Phone Number: [phone] (Primary) Address: City/ State/ Zip: [place] [address] Client [facility] - Client Client Site [place] - Day Physician [place], Frederico Hamman - MD Contact Type Call Who Is Calling Patient / Member / Family / Caregiver Call Type Triage / Clinical Relationship To Patient Self Return Phone Number [phone] (Primary) Chief Complaint Neck Pain Reason for Call Symptomatic / Request for [place] is having neck and back pain going down his right arm. Translation No Nurse Assessment Nurse: D'Heur Lucia Gaskins, RN, Adrienne Date/Time (Eastern Time): 09/04/2021 3:11:52 PM Confirm and document reason for call. If symptomatic, describe symptoms. ---Caller states he is having neck and back pain going down his right arm since Friday. This happened before about 3-4 years ago. His pain becomes severe when he stands. Does the patient have any new or worsening symptoms? ---Yes Will a triage be completed? ---Yes Related visit to physician within the last 2 weeks? ---No Does the PT have any chronic conditions? (i.e. diabetes, asthma, this includes High risk factors for pregnancy, etc.) ---No Is this a behavioral health or substance abuse call? ---No Guidelines Guideline Title Affirmed Question Affirmed Notes Nurse Date/Time Eilene Ghazi Time) Neck Pain or Stiffness [1] MODERATE neck pain (e.g., interferes with normal activities) AND [2] present > 3 days D'Heur Lucia Gaskins, RN, Adrienne 09/04/2021 3:13:31 PM Disp. Time Eilene Ghazi Time) Disposition Final User 09/04/2021 3:18:58 PM SEE PCP WITHIN 3 DAYS Yes D'Heur Lucia Gaskins, RN, Adrienne PLEASE NOTE: All timestamps contained within this report are represented as [place]  Standard Time. CONFIDENTIALTY NOTICE: This fax transmission is intended only for the addressee. It contains information that is legally privileged, confidential or otherwise protected from use or disclosure. If you are not the intended recipient, you are strictly prohibited from reviewing, disclosing, copying using or disseminating any of this information or taking any action in reliance on or regarding this information. If you have received this fax in error, please notify us immediately by telephone so that we can arrange for its return to [place]. Phone: [phone], Toll-Free: [phone], Fax: [phone] Page: 2 of 2 Call Id: [id number] [place] Disagree/Comply Comply Caller Understands Yes PreDisposition Call Doctor Care Advice Given Per Guideline SEE PCP WITHIN 3 DAYS: * You need to be seen within 2 or 3 days. PAIN MEDICINES: * For pain relief, you can take either acetaminophen, ibuprofen, or naproxen. * They are over-the-counter (OTC) pain drugs. You can buy them at the drugstore. SLEEP: * Sleep on the back or side, not the abdomen. * Sleep with a neck collar. Use a foam neck collar (from a pharmacy) OR a small towel wrapped around the neck. Reason: Keep the head from moving too much during sleep. ACTIVITY: * Limit neck movement for 48 hours. * After 48 hours begin gentle stretching exercises. STRETCHING EXERCISES: * Improve the strength of the neck muscles with 2 or 3 minutes of gentle stretching exercises each day. * Touch your chin to your shoulder, touch your ear to each shoulder, and move your head up and down. * Don't use any pressure when you do these stretching exercises. CALL BACK IF: * You become worse CARE ADVICE given per Neck Pain (Adult) guideline. Comments User: Vincente Liberty, D'Heur Lucia Gaskins, RN Date/Time Eilene Ghazi Time): 09/04/2021 3:20:24 PM Warm transferred caller to appointment line

## 2021-09-04 NOTE — Telephone Encounter (Signed)
Okay--I will see him at the appointment then

## 2021-09-05 ENCOUNTER — Other Ambulatory Visit: Payer: Self-pay

## 2021-09-05 ENCOUNTER — Ambulatory Visit: Payer: Commercial Managed Care - PPO | Admitting: Internal Medicine

## 2021-09-05 ENCOUNTER — Encounter: Payer: Self-pay | Admitting: Internal Medicine

## 2021-09-05 DIAGNOSIS — S46811A Strain of other muscles, fascia and tendons at shoulder and upper arm level, right arm, initial encounter: Secondary | ICD-10-CM

## 2021-09-05 MED ORDER — CYCLOBENZAPRINE HCL 10 MG PO TABS: 5.0000 mg | ORAL_TABLET | Freq: Every evening | ORAL | 0 refills | Status: DC | PRN

## 2021-09-05 NOTE — Assessment & Plan Note (Signed)
Discussed heat, continued ibuprofen Limit lifting in the gym till pain better Will Rx cyclobenzaprine for bedtime prn

## 2021-09-05 NOTE — Progress Notes (Signed)
   Subjective:    Patient ID: Jay Price., male    DOB: [birth year], 45 y.o.   MRN: [id number]  HPI Here due to back and neck pain This visit occurred during the SARS-CoV-2 public health emergency.  Safety protocols were in place, including screening questions prior to the visit, additional usage of staff PPE, and extensive cleaning of exam room while observing appropriate contact time as indicated for disinfecting solutions.   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   No current facility-administered medications on file prior to visit.    No Known Allergies  Past Medical History:  Diagnosis Date   Kidney stone    Seizures ([place])    Distantly, had about 5 seizures, usually associated with great fatigue and strain. Last 10 years ago. Tonic-clonic activity described. Post-ictal state.    History reviewed. No pertinent surgical history.  Family History  Problem Relation Age of Onset   Prostate cancer Neg Hx    Bladder Cancer Neg Hx    Kidney Stones Neg Hx     Social History   Socioeconomic History   Marital status: Divorced    Spouse name: Not on file   Number of children: Not on file   Years of education: Not on file   Highest education level: Not on file  Occupational History   Not on file  Tobacco Use   Smoking status: Never   Smokeless tobacco: Never  Vaping Use   Vaping  Use: Never used  Substance and Sexual Activity   Alcohol use: Yes    Alcohol/week: 0.0 standard drinks    Comment: Socially    Drug use: No   Sexual activity: Yes    Birth control/protection: None  Other Topics Concern   Not on file  Social History Narrative   Not on file   Social Determinants of Health   Financial Resource Strain: Not on file  Food Insecurity: Not on file  Transportation Needs: Not on file  Physical Activity: Not on file  Stress: Not on file  Social Connections: Not on file  Intimate Partner Violence: Not on file   Review of Systems Works out regularly--but has held off     Objective:   Physical Exam Constitutional:      Appearance: Normal appearance.  Neck:     Comments: Tenderness along right trapezius--and is tight Limited neck flexion due to that pain No spine tenderness Musculoskeletal:     Comments: Normal ROM in right shoulder  Neurological:     Mental Status: He is alert.     Comments: No arm weakness           Assessment & Plan:

## 2021-09-11 ENCOUNTER — Ambulatory Visit: Payer: Commercial Managed Care - PPO | Admitting: Family Medicine

## 2022-03-20 ENCOUNTER — Other Ambulatory Visit (INDEPENDENT_AMBULATORY_CARE_PROVIDER_SITE_OTHER): Payer: Commercial Managed Care - PPO

## 2022-03-20 DIAGNOSIS — Z13220 Encounter for screening for lipoid disorders: Secondary | ICD-10-CM

## 2022-03-20 DIAGNOSIS — Z125 Encounter for screening for malignant neoplasm of prostate: Secondary | ICD-10-CM | POA: Diagnosis not present

## 2022-03-20 DIAGNOSIS — Z0000 Encounter for general adult medical examination without abnormal findings: Secondary | ICD-10-CM

## 2022-03-20 DIAGNOSIS — Z113 Encounter for screening for infections with a predominantly sexual mode of transmission: Secondary | ICD-10-CM

## 2022-03-20 DIAGNOSIS — Z131 Encounter for screening for diabetes mellitus: Secondary | ICD-10-CM

## 2022-03-20 LAB — PSA: PSA: 1.04 ng/mL (ref 0.10–4.00)

## 2022-03-20 LAB — CBC WITH DIFFERENTIAL/PLATELET
Basophils Absolute: 0 10*3/uL (ref 0.0–0.1)
Basophils Relative: 0.9 % (ref 0.0–3.0)
Eosinophils Absolute: 0.2 10*3/uL (ref 0.0–0.7)
Eosinophils Relative: 4.6 % (ref 0.0–5.0)
HCT: 40.4 % (ref 39.0–52.0)
Hemoglobin: 13.7 g/dL (ref 13.0–17.0)
Lymphocytes Relative: 36.1 % (ref 12.0–46.0)
Lymphs Abs: 1.9 10*3/uL (ref 0.7–4.0)
MCHC: 33.8 g/dL (ref 30.0–36.0)
MCV: 90 fl (ref 78.0–100.0)
Monocytes Absolute: 0.5 10*3/uL (ref 0.1–1.0)
Monocytes Relative: 9.9 % (ref 3.0–12.0)
Neutro Abs: 2.5 10*3/uL (ref 1.4–7.7)
Neutrophils Relative %: 48.5 % (ref 43.0–77.0)
Platelets: 228 10*3/uL (ref 150.0–400.0)
RBC: 4.49 Mil/uL (ref 4.22–5.81)
RDW: 13.2 % (ref 11.5–15.5)
WBC: 5.2 10*3/uL (ref 4.0–10.5)

## 2022-03-20 LAB — BASIC METABOLIC PANEL
BUN: 18 mg/dL (ref 6–23)
CO2: 29 mEq/L (ref 19–32)
Calcium: 9.3 mg/dL (ref 8.4–10.5)
Chloride: 105 mEq/L (ref 96–112)
Creatinine, Ser: 1.28 mg/dL (ref 0.40–1.50)
GFR: 67.69 mL/min (ref >60.00)
Glucose, Bld: 95 mg/dL (ref 70–99)
Potassium: 4.4 mEq/L (ref 3.5–5.1)
Sodium: 140 mEq/L (ref 135–145)

## 2022-03-20 LAB — HEPATIC FUNCTION PANEL
ALT: 19 U/L (ref 0–53)
AST: 23 U/L (ref 0–37)
Albumin: 4.3 g/dL (ref 3.5–5.2)
Alkaline Phosphatase: 69 U/L (ref 39–117)
Bilirubin, Direct: 0.1 mg/dL (ref 0.0–0.3)
Total Bilirubin: 0.6 mg/dL (ref 0.2–1.2)
Total Protein: 6.8 g/dL (ref 6.0–8.3)

## 2022-03-20 LAB — LIPID PANEL
Cholesterol: 149 mg/dL (ref 0–200)
HDL: 43.1 mg/dL (ref >39.00)
LDL Cholesterol: 94 mg/dL (ref 0–99)
NonHDL: 105.61
Total CHOL/HDL Ratio: 3
Triglycerides: 57 mg/dL (ref 0.0–149.0)
VLDL: 11.4 mg/dL (ref 0.0–40.0)

## 2022-03-20 LAB — HEMOGLOBIN A1C: Hgb A1c MFr Bld: 5.7 % (ref 4.6–6.5)

## 2022-03-21 LAB — HIV ANTIBODY (ROUTINE TESTING W REFLEX): HIV 1&2 Ab, 4th Generation: NONREACTIVE

## 2022-03-21 LAB — RPR: RPR Ser Ql: NONREACTIVE

## 2022-03-28 ENCOUNTER — Other Ambulatory Visit: Payer: Self-pay

## 2022-03-28 ENCOUNTER — Encounter: Payer: Self-pay | Admitting: Family Medicine

## 2022-03-28 ENCOUNTER — Ambulatory Visit (INDEPENDENT_AMBULATORY_CARE_PROVIDER_SITE_OTHER): Payer: Commercial Managed Care - PPO | Admitting: Family Medicine

## 2022-03-28 VITALS — BP 98/60 | HR 64 | Temp 98.1°F | Ht 71.0 in | Wt 176.1 lb

## 2022-03-28 DIAGNOSIS — Z1211 Encounter for screening for malignant neoplasm of colon: Secondary | ICD-10-CM

## 2022-03-28 DIAGNOSIS — Z0000 Encounter for general adult medical examination without abnormal findings: Secondary | ICD-10-CM | POA: Diagnosis not present

## 2022-03-28 DIAGNOSIS — Z113 Encounter for screening for infections with a predominantly sexual mode of transmission: Secondary | ICD-10-CM | POA: Diagnosis not present

## 2022-03-28 MED ORDER — CLENPIQ 10-3.5-12 MG-GM -GM/160ML PO SOLN: 1.0000 | ORAL | 0 refills | Status: DC

## 2022-03-28 MED ORDER — TAMSULOSIN HCL 0.4 MG PO CAPS: 0.4000 mg | ORAL_CAPSULE | Freq: Every day | ORAL | 3 refills | Status: DC

## 2022-03-28 NOTE — Progress Notes (Signed)
Gastroenterology Pre-Procedure Review ? ?Request Date: 04/04/2022 ?Requesting Physician: Dr. Vicente Males ? ?PATIENT REVIEW QUESTIONS: The patient responded to the following health history questions as indicated:   ? ?1. Are you having any GI issues? no ?2. Do you have a personal history of Polyps? no ?3. Do you have a family history of Colon Cancer or Polyps? no ?4. Diabetes Mellitus? no ?5. Joint replacements in the past 12 months?no ?6. Major health problems in the past 3 months?no ?7. Any artificial heart valves, MVP, or defibrillator?no ?   ?MEDICATIONS & ALLERGIES:    ?Patient reports the following regarding taking any anticoagulation/antiplatelet therapy:   ?Plavix, Coumadin, Eliquis, Xarelto, Lovenox, Pradaxa, Brilinta, or Effient? no ?Aspirin? no ? ?Patient confirms/reports the following medications:  ?Current Outpatient Medications  ?Medication Sig Dispense Refill  ? tamsulosin (FLOMAX) 0.4 MG CAPS capsule Take 1 capsule (0.4 mg total) by mouth daily. 30 capsule 3  ? ?No current facility-administered medications for this visit.  ? ? ?Patient confirms/reports the following allergies:  ?No Known Allergies ? ?No orders of the defined types were placed in this encounter. ? ? ?AUTHORIZATION INFORMATION ?Primary Insurance: ?1D#: ?Group #: ? ?Secondary Insurance: ?1D#: ?Group #: ? ?SCHEDULE INFORMATION: ?Date: 04/04/2022 ?Time: ?Location: [facility] ?

## 2022-03-28 NOTE — Progress Notes (Signed)
? ? ?Jay Jay Price T. Leatha Rohner, MD, [facility] ?Therapist, music at [facility] ?[place] ?[place], [address] ? ?Phone: [phone]  FAX: [phone] ? ?Jay Jay Price. - 46 y.o. male  MRN [id number]  Date of Birth: Jun 16, 1976 ? ?Date: 03/28/2022  PCP: Owens Loffler, MD  Referral: Owens Loffler, MD ? ?Chief Complaint  ?Patient presents with  ? Annual Exam  ? ? ?This visit occurred during the SARS-CoV-2 public health emergency.  Safety protocols were in place, including screening questions prior to the visit, additional usage of staff PPE, and extensive cleaning of exam room while observing appropriate contact time as indicated for disinfecting solutions.  ? ?Patient Care Team: ?Owens Loffler, MD as PCP - General (Family Medicine) ?Subjective:  ? ?Jay Jay Price. is a 46 y.o. pleasant patient who presents with the following: ? ?Preventative Health Maintenance Visit: ? ?Health Maintenance Summary Reviewed and updated, unless pt declines services. ? ?Tobacco History Reviewed. ?Alcohol: No concerns, no excessive use ?Exercise Habits: Work out at [employer].  At home, too. ?STD concerns: no risk or activity to increase risk ?Drug Use: None ? ?Very nice patient who I have known for many years.  Globally he is feeling well, however the main issue he is having is going to the bathroom and urinating many times during the day.  Roughly 20 times, he is having whole time difficulty with holding his bladder, although he denies any urinary incontinence. ? ?Colon ?Flu? ? ?Covid primary ? ?Urinating a lot - about 20 times a day. ?All the time.  ?Does have a history. ? ?Foot cramps and muscle twitches.  ? ?70-80 hours a week working. ? ?Trial of flomax. ? ? ? ?Health Maintenance  ?Topic Date Due  ? COVID-19 Vaccine (3 - Pfizer risk series) 09/30/2020  ? COLONOSCOPY (Pts 45-27yr Insurance coverage will need to be confirmed)  Never done  ? INFLUENZA VACCINE  06/19/2022  ? TETANUS/TDAP   01/09/2027  ? Hepatitis C Screening  Completed  ? HIV Screening  Completed  ? HPV VACCINES  Aged Out  ? ?Immunization History  ?Administered Date(s) Administered  ? PFIZER Comirnaty(Gray Top)Covid-19 Tri-Sucrose Vaccine 08/12/2020, 09/02/2020  ? Tdap 01/09/2017  ? ?Patient Active Problem List  ? Diagnosis Date Noted  ? Nephrolithiasis 02/05/2018  ? ? ?Past Medical History:  ?Diagnosis Date  ? Kidney stone   ? [place]   ? Distantly, had about 5 seizures, usually associated with great fatigue and strain. Last 10 years ago. Tonic-clonic activity described. Post-ictal state.  ? ? ?History reviewed. No pertinent surgical history. ? ?Family History  ?Problem Relation Age of Onset  ? Prostate cancer Neg Hx   ? Bladder Cancer Neg Hx   ? Kidney Stones Neg Hx   ? ? ?Social History  ? ?Social History Narrative  ? Not on file  ? ? ?Past Medical History, Surgical History, Social History, Family History, Problem List, Medications, and Allergies have been reviewed and updated if relevant. ? ?Review of Systems: Pertinent positives are listed above.  Otherwise, a full 14 point review of systems has been done in full and it is negative except where it is noted positive. ? ?Objective:  ? ?BP 98/60   Pulse 64   Temp 98.1 ?F (36.7 ?C) (Oral)   Ht '5\' 11"'$  (1.803 m)   Wt 176 lb 1 oz (79.9 kg)   SpO2 98%   BMI 24.56 kg/m?  ?Ideal Body Weight: Weight in (lb) to have  BMI = 25: 178.9 ? ?Ideal Body Weight: Weight in (lb) to have BMI = 25: 178.9 ?No results found. ? ?  03/28/2022  ?  8:27 AM 03/23/2021  ?  8:28 AM 03/23/2021  ?  8:27 AM 04/29/2019  ?  9:07 AM 01/20/2018  ?  8:36 AM  ?Depression screen PHQ 2/9  ?Decreased Interest 0 0 0 0 0  ?Down, Depressed, Hopeless 0 0 0 0 0  ?PHQ - 2 Score 0 0 0 0 0  ?Altered sleeping  1     ?Tired, decreased energy  1     ?Change in appetite  0     ?Feeling bad or failure about yourself   0     ?Trouble concentrating  1     ?Moving slowly or fidgety/restless  0     ?Suicidal thoughts  0     ?PHQ-9  Score  3     ?Difficult doing work/chores  Not difficult at all     ? ? ? ?GEN: well developed, well nourished, no acute distress ?Eyes: conjunctiva and lids normal, PERRLA, EOMI ?ENT: TM clear, nares clear, oral exam WNL ?Neck: supple, no lymphadenopathy, no thyromegaly, no JVD ?Pulm: clear to auscultation and percussion, respiratory effort normal ?CV: regular rate and rhythm, S1-S2, no murmur, rub or gallop, no bruits, peripheral pulses normal and symmetric, no cyanosis, clubbing, edema or varicosities ?GI: soft, non-tender; no hepatosplenomegaly, masses; active bowel sounds all quadrants ?GU: deferred ?Lymph: no cervical, axillary or inguinal adenopathy ?MSK: gait normal, muscle tone and strength WNL, no joint swelling, effusions, discoloration, crepitus  ?SKIN: clear, good turgor, color WNL, no rashes, lesions, or ulcerations ?Neuro: normal mental status, normal strength, sensation, and motion ?Psych: alert; oriented to person, place and time, normally interactive and not anxious or depressed in appearance. ? ?All labs reviewed with patient. ?Results for orders placed or performed in visit on 03/20/22  ?Lipid panel  ?Result Value Ref Range  ? Cholesterol 149 0 - 200 mg/dL  ? Triglycerides 57.0 0.0 - 149.0 mg/dL  ? HDL 43.10 >39.00 mg/dL  ? VLDL 11.4 0.0 - 40.0 mg/dL  ? LDL Cholesterol 94 0 - 99 mg/dL  ? Total CHOL/HDL Ratio 3   ? NonHDL 105.61   ?Hepatic function panel  ?Result Value Ref Range  ? Total Bilirubin 0.6 0.2 - 1.2 mg/dL  ? Bilirubin, Direct 0.1 0.0 - 0.3 mg/dL  ? Alkaline Phosphatase 69 39 - 117 U/L  ? AST 23 0 - 37 U/L  ? ALT 19 0 - 53 U/L  ? Total Protein 6.8 6.0 - 8.3 g/dL  ? Albumin 4.3 3.5 - 5.2 g/dL  ?CBC with Differential/Platelet  ?Result Value Ref Range  ? WBC 5.2 4.0 - 10.5 K/uL  ? RBC 4.49 4.22 - 5.81 Mil/uL  ? Hemoglobin 13.7 13.0 - 17.0 g/dL  ? HCT 40.4 39.0 - 52.0 %  ? MCV 90.0 78.0 - 100.0 fl  ? MCHC 33.8 30.0 - 36.0 g/dL  ? RDW 13.2 11.5 - 15.5 %  ? Platelets 228.0 150.0 - 400.0 K/uL   ? Neutrophils Relative % 48.5 43.0 - 77.0 %  ? Lymphocytes Relative 36.1 12.0 - 46.0 %  ? Monocytes Relative 9.9 3.0 - 12.0 %  ? Eosinophils Relative 4.6 0.0 - 5.0 %  ? Basophils Relative 0.9 0.0 - 3.0 %  ? Neutro Abs 2.5 1.4 - 7.7 K/uL  ? Lymphs Abs 1.9 0.7 - 4.0 K/uL  ? Monocytes Absolute 0.5 0.1 -  1.0 K/uL  ? Eosinophils Absolute 0.2 0.0 - 0.7 K/uL  ? Basophils Absolute 0.0 0.0 - 0.1 K/uL  ?Basic metabolic panel  ?Result Value Ref Range  ? Sodium 140 135 - 145 mEq/L  ? Potassium 4.4 3.5 - 5.1 mEq/L  ? Chloride 105 96 - 112 mEq/L  ? CO2 29 19 - 32 mEq/L  ? Glucose, Bld 95 70 - 99 mg/dL  ? BUN 18 6 - 23 mg/dL  ? Creatinine, Ser 1.28 0.40 - 1.50 mg/dL  ? GFR 67.69 >60.00 mL/min  ? Calcium 9.3 8.4 - 10.5 mg/dL  ?Hemoglobin A1c  ?Result Value Ref Range  ? Hgb A1c MFr Bld 5.7 4.6 - 6.5 %  ?PSA  ?Result Value Ref Range  ? PSA 1.04 0.10 - 4.00 ng/mL  ?HIV Antibody (routine testing w rflx)  ?Result Value Ref Range  ? HIV 1&2 Ab, 4th Generation NON-REACTIVE NON-REACTIVE  ?RPR  ?Result Value Ref Range  ? RPR Ser Ql NON-REACTIVE NON-REACTIVE  ? ? ?Assessment and Plan:  ? ?  ICD-10-CM   ?1. Healthcare maintenance  Z00.00   ?  ?2. Screening for STD (sexually transmitted disease)  Z11.3 C. trachomatis/N. gonorrhoeae RNA  ?  Trichomonas vaginalis, RNA  ?  ?3. Colon cancer screening  Z12.11 Ambulatory referral to Gastroenterology  ?  ? ?Do a trial of Flomax for today.  If this does not work in a couple weeks, of asked him to contact me via [facility], and at that point we can try antispasmodic for overactive bladder. ? ?Colonoscopy referral. ? ?Health Maintenance Exam: The patient's preventative maintenance and recommended screening tests for an annual wellness exam were reviewed in full today. ?Brought up to date unless services declined. ? ?Counselled on the importance of diet, exercise, and its role in overall health and mortality. ?The patient's FH and SH was reviewed, including their home life, tobacco status, and drug and  alcohol status. ? ?Follow-up in 1 year for physical exam or additional follow-up below. ? ?Follow-up: No follow-ups on file. ?Or follow-up in 1 year if not noted. ? ?Meds ordered this encounter  ?Medications  ? tamsul

## 2022-03-29 LAB — C. TRACHOMATIS/N. GONORRHOEAE RNA
C. trachomatis RNA, TMA: NOT DETECTED
N. gonorrhoeae RNA, TMA: NOT DETECTED

## 2022-03-29 LAB — TRICHOMONAS VAGINALIS, PROBE AMP: Trichomonas vaginalis RNA: NOT DETECTED

## 2022-04-02 ENCOUNTER — Telehealth: Payer: Self-pay | Admitting: Gastroenterology

## 2022-04-02 NOTE — Telephone Encounter (Signed)
Patient called stating that he wants to reschedule his colonoscopy. Requesting a call back. ?

## 2022-04-03 ENCOUNTER — Telehealth: Payer: Self-pay

## 2022-04-03 NOTE — Telephone Encounter (Signed)
Pts call returned. LVM for him to call office back to reschedule tomorrows colonoscopy with Dr. Raphael Gibney patient request. ? ?Thanks, ?Sharyn Lull, CMA ?

## 2022-04-04 ENCOUNTER — Telehealth: Payer: Self-pay

## 2022-04-04 NOTE — Telephone Encounter (Signed)
Patients call has been returned to reschedule his colonoscopy.  Colonoscopy has been rescheduled to 05/02/22 with Dr. Vicente Males. ?Trish in Endo notified. ? ?Thanks, ?Sharyn Lull, CMA ?

## 2022-05-01 NOTE — Anesthesia Preprocedure Evaluation (Signed)
Anesthesia Evaluation  Patient identified by MRN, date of birth, ID band Patient awake    Reviewed: Allergy & Precautions, NPO status , Patient's Chart, lab work & pertinent test results  Airway Mallampati: III  TM Distance: >3 FB Neck ROM: full    Dental  (+) Chipped   Pulmonary neg pulmonary ROS,    Pulmonary exam normal        Cardiovascular negative cardio ROS Normal cardiovascular exam     Neuro/Psych Seizures -, Well Controlled,  Distantly, had about 5 seizures, usually associated with great fatigue and strain. Last 10 years ago. Tonic-clonic activity described. Post-ictal state. negative psych ROS   GI/Hepatic negative GI ROS, Neg liver ROS,   Endo/Other  negative endocrine ROS  Renal/GU negative Renal ROS  negative genitourinary   Musculoskeletal   Abdominal   Peds  Hematology negative hematology ROS (+)   Anesthesia Other Findings Past Medical History: No date: Kidney stone No date: Seizures ([place])     Comment:  Distantly, had about 5 seizures, usually associated with              great fatigue and strain. Last 10 years ago. Tonic-clonic              activity described. Post-ictal state.  No past surgical history on file.     Reproductive/Obstetrics negative OB ROS                             Anesthesia Physical Anesthesia Plan  ASA: 2  Anesthesia Plan: General   Post-op Pain Management:    Induction:   PONV Risk Score and Plan: Propofol infusion and TIVA  Airway Management Planned:   Additional Equipment:   Intra-op Plan:   Post-operative Plan:   Informed Consent:     Dental Advisory Given  Plan Discussed with: Anesthesiologist, CRNA and Surgeon  Anesthesia Plan Comments:         Anesthesia Quick Evaluation

## 2022-05-02 ENCOUNTER — Ambulatory Visit
Admission: RE | Admit: 2022-05-02 | Discharge: 2022-05-02 | Disposition: A | Discharge status: Home / Self Care | Payer: Commercial Managed Care - PPO | Attending: Gastroenterology | Admitting: Gastroenterology

## 2022-05-02 ENCOUNTER — Encounter
Admission: RE | Disposition: A | Discharge status: Home / Self Care | Payer: Self-pay | Source: Home / Self Care | Attending: Gastroenterology

## 2022-05-02 ENCOUNTER — Ambulatory Visit: Payer: Commercial Managed Care - PPO | Admitting: Anesthesiology

## 2022-05-02 ENCOUNTER — Encounter: Payer: Self-pay | Admitting: Gastroenterology

## 2022-05-02 DIAGNOSIS — G40909 Epilepsy, unspecified, not intractable, without status epilepticus: Secondary | ICD-10-CM | POA: Diagnosis not present

## 2022-05-02 DIAGNOSIS — Z1211 Encounter for screening for malignant neoplasm of colon: Secondary | ICD-10-CM

## 2022-05-02 DIAGNOSIS — D122 Benign neoplasm of ascending colon: Secondary | ICD-10-CM

## 2022-05-02 HISTORY — PX: COLONOSCOPY WITH PROPOFOL: SHX5780

## 2022-05-02 SURGERY — COLONOSCOPY WITH PROPOFOL
Anesthesia: General

## 2022-05-02 MED ORDER — PROPOFOL 500 MG/50ML IV EMUL
INTRAVENOUS | Status: DC | PRN
  Administered 2022-05-02: 150 ug/kg/min via INTRAVENOUS

## 2022-05-02 MED ORDER — PROPOFOL 1000 MG/100ML IV EMUL
INTRAVENOUS | Status: AC
  Filled 2022-05-02: qty 100

## 2022-05-02 MED ORDER — SODIUM CHLORIDE 0.9 % IV SOLN
INTRAVENOUS | Status: DC
  Administered 2022-05-02: 1000 mL via INTRAVENOUS

## 2022-05-02 NOTE — Transfer of Care (Signed)
Immediate Anesthesia Transfer of Care Note  Patient: Jay Price.  Procedure(s) Performed: COLONOSCOPY WITH PROPOFOL  Patient Location: PACU  Anesthesia Type:General  Level of Consciousness: awake and sedated  Airway & Oxygen Therapy: Patient Spontanous Breathing and Patient connected to nasal cannula oxygen  Post-op Assessment: Report given to RN and Post -op Vital signs reviewed and stable  Post vital signs: Reviewed and stable  Last Vitals:  Vitals Value Taken Time  BP    Temp    Pulse    Resp    SpO2      Last Pain:  Vitals:   05/02/22 0706  TempSrc: Temporal  PainSc: 0-No pain         Complications: No notable events documented.

## 2022-05-02 NOTE — H&P (Signed)
Jonathon Bellows, MD [address] [place], [place], [place], [address] [address], [place], [place], [place], [address] Phone: [phone]  Fax: [phone]  Primary Care Physician:  Owens Loffler, MD   Pre-Procedure History & Physical: HPI:  Jay Price. is a 46 y.o. male is here for an colonoscopy.   Past Medical History:  Diagnosis Date   Seizures ([place])    Distantly, had about 5 seizures, usually associated with great fatigue and strain. Last 10 years ago. Tonic-clonic activity described. Post-ictal state.    History reviewed. No pertinent surgical history.  Prior to Admission medications   Medication Sig Start Date End Date Taking? Authorizing Provider  Sod Picosulfate-Mag Ox-Cit Acd (CLENPIQ) 10-3.5-12 MG-GM -GM/160ML SOLN Take 1 kit by mouth as directed. At 5 PM evening before procedure, drink 1 bottle of Clenpiq, hydrate, drink (5) 8 oz of water. Then do the same thing 5 hours prior to your procedure. 03/28/22  Yes Jonathon Bellows, MD  tamsulosin (FLOMAX) 0.4 MG CAPS capsule Take 1 capsule (0.4 mg total) by mouth daily. 03/28/22  Yes Copland, Frederico Hamman, MD    Allergies as of 03/28/2022   (No Known Allergies)    Family History  Problem Relation Age of Onset   Prostate cancer Neg Hx    Bladder Cancer Neg Hx    Kidney Stones Neg Hx     Social History   Socioeconomic History   Marital status: Divorced    Spouse name: Not on file   Number of children: Not on file   Years of education: Not on file   Highest education level: Not on file  Occupational History   Not on file  Tobacco Use   Smoking status: Never   Smokeless tobacco: Never  Vaping Use   Vaping Use: Never used  Substance and Sexual Activity   Alcohol use: Yes    Alcohol/week: 0.0 standard drinks of alcohol    Comment: Socially    Drug use: No   Sexual activity: Yes    Birth control/protection: None  Other Topics Concern   Not on file  Social History Narrative   Not on file    Social Determinants of Health   Financial Resource Strain: Not on file  Food Insecurity: Not on file  Transportation Needs: Not on file  Physical Activity: Not on file  Stress: Not on file  Social Connections: Not on file  Intimate Partner Violence: Not on file    Review of Systems: See HPI, otherwise negative ROS  Physical Exam: BP 128/75   Pulse 82   Temp (!) 97 F (36.1 C) (Temporal)   Resp 18   Ht _0  (1.803 m)   Wt 79.5 kg   SpO2 100%   BMI 24.45 kg/m  General:   Alert,  pleasant and cooperative in NAD Head:  Normocephalic and atraumatic. Neck:  Supple; no masses or thyromegaly. Lungs:  Clear throughout to auscultation, normal respiratory effort.    Heart:  +S1, +S2, Regular rate and rhythm, No edema. Abdomen:  Soft, nontender and nondistended. Normal bowel sounds, without guarding, and without rebound.   Neurologic:  Alert and  oriented x4;  grossly normal neurologically.  Impression/Plan: Jay Price. is here for an colonoscopy to be performed for Screening colonoscopy average risk   Risks, benefits, limitations, and alternatives regarding  colonoscopy have been reviewed with the patient.  Questions have been answered.  All parties agreeable.   Jonathon Bellows, MD  05/02/2022,  7:50 AM

## 2022-05-02 NOTE — Anesthesia Procedure Notes (Signed)
Date/Time: 05/02/2022 8:01 AM  Performed by: Donalda Ewings, CindyPre-anesthesia Checklist: Patient identified, Emergency Drugs available, Suction available, Patient being monitored and Timeout performed Patient Re-evaluated:Patient Re-evaluated prior to induction Oxygen Delivery Method: Nasal cannula Preoxygenation: Pre-oxygenation with 100% oxygen Induction Type: IV induction Placement Confirmation: positive ETCO2 and CO2 detector

## 2022-05-02 NOTE — Op Note (Signed)
[facility] Gastroenterology Patient Name: Jay Price Procedure Date: 05/02/2022 7:26 AM MRN: [id number] Account #: [id number] Date of Birth: Oct 09, 1976 Admit Type: Outpatient Age: 46 Room: [facility] ENDO ROOM 3 Gender: Male Note Status: Finalized Instrument Name: Jasper Riling [id number] Procedure:             Colonoscopy Indications:           Screening for colorectal malignant neoplasm Providers:             Jonathon Bellows MD, MD Medicines:             Monitored Anesthesia Care Complications:         No immediate complications. Procedure:             Pre-Anesthesia Assessment:                        - Prior to the procedure, a History and Physical was                         performed, and patient medications, allergies and                         sensitivities were reviewed. The patient's tolerance                         of previous anesthesia was reviewed.                        - The risks and benefits of the procedure and the                         sedation options and risks were discussed with the                         patient. All questions were answered and informed                         consent was obtained.                        - ASA Grade Assessment: II - A patient with mild                         systemic disease.                        After obtaining informed consent, the colonoscope was                         passed under direct vision. Throughout the procedure,                         the patient's blood pressure, pulse, and oxygen                         saturations were monitored continuously. The                         Colonoscope was introduced through the anus and  advanced to the the cecum, identified by the                         appendiceal orifice. The colonoscopy was performed                         with ease. The patient tolerated the procedure well.                         The quality of the bowel preparation  was excellent. Findings:      The perianal and digital rectal examinations were normal.      A 10 mm polyp was found in the proximal ascending colon. The polyp was       sessile. The polyp was removed with a cold snare. Resection and       retrieval were complete.      The exam was otherwise without abnormality on direct and retroflexion       views. Impression:            - One 10 mm polyp in the proximal ascending colon,                         removed with a cold snare. Resected and retrieved.                        - The examination was otherwise normal on direct and                         retroflexion views. Recommendation:        - Discharge patient to home (with escort).                        - Resume previous diet.                        - Continue present medications.                        - Await pathology results.                        - Repeat colonoscopy in 3 years for surveillance. Procedure Code(s):     --- Professional ---                        [id number], Colonoscopy, flexible; with removal of                         tumor(s), polyp(s), or other lesion(s) by snare                         technique Diagnosis Code(s):     --- Professional ---                        Z12.11, Encounter for screening for malignant neoplasm                         of colon  K63.5, Polyp of colon CPT copyright 2019 American Medical Association. All rights reserved. The codes documented in this report are preliminary and upon coder review may  be revised to meet current compliance requirements. Jonathon Bellows, MD Jonathon Bellows MD, MD 05/02/2022 8:14:22 AM This report has been signed electronically. Number of Addenda: 0 Note Initiated On: 05/02/2022 7:26 AM Scope Withdrawal Time: 0 hours 10 minutes 17 seconds  Total Procedure Duration: 0 hours 17 minutes 15 seconds  Estimated Blood Loss:  Estimated blood loss: none.      [facility]

## 2022-05-03 ENCOUNTER — Encounter: Payer: Self-pay | Admitting: Gastroenterology

## 2022-05-03 LAB — SURGICAL PATHOLOGY

## 2022-05-03 NOTE — Anesthesia Postprocedure Evaluation (Signed)
Anesthesia Post Note  Patient: Jay Price.  Procedure(s) Performed: COLONOSCOPY WITH PROPOFOL  Patient location during evaluation: Endoscopy Anesthesia Type: General Level of consciousness: awake and alert Pain management: pain level controlled Vital Signs Assessment: post-procedure vital signs reviewed and stable Respiratory status: spontaneous breathing, nonlabored ventilation and respiratory function stable Cardiovascular status: blood pressure returned to baseline and stable Postop Assessment: no apparent nausea or vomiting Anesthetic complications: no   No notable events documented.   Last Vitals:  Vitals:   05/02/22 0826 05/02/22 0838  BP: 102/60 116/70  Pulse: (!) 46 (!) 54  Resp: 16 12  Temp:    SpO2: 100% 100%    Last Pain:  Vitals:   05/03/22 0733  TempSrc:   PainSc: 0-No pain                 Iran Ouch

## 2022-05-04 ENCOUNTER — Encounter: Payer: Self-pay | Admitting: Gastroenterology

## 2022-08-20 ENCOUNTER — Encounter: Payer: Self-pay | Admitting: Family Medicine

## 2022-08-20 ENCOUNTER — Ambulatory Visit: Payer: Commercial Managed Care - PPO | Admitting: Family Medicine

## 2022-08-20 VITALS — BP 110/68 | HR 52 | Temp 97.8°F | Ht 71.0 in | Wt 179.5 lb

## 2022-08-20 DIAGNOSIS — H9311 Tinnitus, right ear: Secondary | ICD-10-CM

## 2022-08-20 NOTE — Progress Notes (Signed)
Jay Price T. Jay Collignon, MD, [place] at [facility] [place], [address]  Phone: [phone]  FAX: [place]. - 46 y.o. male  MRN [id number]  Date of Birth: 02-Jan-1976  Date: 08/20/2022  PCP: Owens Loffler, MD  Referral: Owens Loffler, MD  Chief Complaint  Patient presents with   Tinnitus   Subjective:   Jay Price. is a 46 y.o. very pleasant male patient with Body mass index is 25.04 kg/m. who presents with the following:  Always has had some ringing in his ear and it has gotten really loud.  Now ear is super sensitive.  Not getting worse. Much louder. Noticed it in the R ear.  No other associated pain. He did have a viral illness a couple of weeks ago, and he does think that the onset of symptoms correlates to this. Or at least around that time.   No other URI symptoms now, no blurred vision.  No earache.  No ear canal pain, no sinus pain.  No discharge.  Review of Systems is noted in the HPI, as appropriate  Objective:   BP 110/68   Pulse (!) 52   Temp 97.8 F (36.6 C) (Oral)   Ht 5' 11" (1.803 m)   Wt 179 lb 8 oz (81.4 kg)   SpO2 98%   BMI 25.04 kg/m   GEN: No acute distress; alert,appropriate. PULM: Breathing comfortably in no respiratory distress PSYCH: Normally interactive.  The entirety of the ear bilaterally is nontender.  The tympanic membrane is clear, cone of light and anatomy is visualized easily.  There is no redness, no obscuration of the anatomy. No cervical nodes. No cerumen.   Laboratory and Imaging Data:  Assessment and Plan:     ICD-10-CM   1. Tinnitus, right  H93.11      Worsening tinnitus.  This is a challenge.  May correlate to recent illness.  Reassurance that this is highly likely nothing that would be bad, but there is no really good treatments.  [employer] One speech-to-text software was used for transcription in this dictation.   Possible transcriptional errors can occur using Editor, commissioning.   Signed,  Maud Deed. Jeanet Lupe, MD   Outpatient Encounter Medications as of 08/20/2022  Medication Sig   tamsulosin (FLOMAX) 0.4 MG CAPS capsule Take 1 capsule (0.4 mg total) by mouth daily.   [DISCONTINUED] Sod Picosulfate-Mag Ox-Cit Acd (CLENPIQ) 10-3.5-12 MG-GM -GM/160ML SOLN Take 1 kit by mouth as directed. At 5 PM evening before procedure, drink 1 bottle of Clenpiq, hydrate, drink (5) 8 oz of water. Then do the same thing 5 hours prior to your procedure.   No facility-administered encounter medications on file as of 08/20/2022.

## 2023-05-06 ENCOUNTER — Telehealth: Payer: Self-pay | Admitting: Family Medicine

## 2023-05-06 DIAGNOSIS — Z131 Encounter for screening for diabetes mellitus: Secondary | ICD-10-CM

## 2023-05-06 DIAGNOSIS — Z13220 Encounter for screening for lipoid disorders: Secondary | ICD-10-CM

## 2023-05-06 DIAGNOSIS — R5383 Other fatigue: Secondary | ICD-10-CM

## 2023-05-06 DIAGNOSIS — Z125 Encounter for screening for malignant neoplasm of prostate: Secondary | ICD-10-CM

## 2023-05-06 DIAGNOSIS — Z113 Encounter for screening for infections with a predominantly sexual mode of transmission: Secondary | ICD-10-CM

## 2023-05-06 NOTE — Telephone Encounter (Signed)
Contacted pt to schedule this yr's cpe with Dr. Patsy Lager. Pt requested to also have STD testings done as well. Pt asked could the testings be comp during lab visit on 8/5 or does he need to wait until cpe with Dr. Patsy Lager on 8/12? Call back # [phone]

## 2023-05-07 NOTE — Telephone Encounter (Signed)
Please return call  I went ahead and ordered all of his future labs to include an STD panel.  He can do it all on his lab draw.  Be sure to do both a blood draw and urine sample.

## 2023-05-07 NOTE — Telephone Encounter (Signed)
Called patient reviewed all information and repeated back to me. Will call if any questions.  ? ?

## 2023-06-24 ENCOUNTER — Other Ambulatory Visit (INDEPENDENT_AMBULATORY_CARE_PROVIDER_SITE_OTHER): Payer: Commercial Managed Care - PPO

## 2023-06-24 DIAGNOSIS — Z131 Encounter for screening for diabetes mellitus: Secondary | ICD-10-CM | POA: Diagnosis not present

## 2023-06-24 DIAGNOSIS — R5383 Other fatigue: Secondary | ICD-10-CM

## 2023-06-24 DIAGNOSIS — Z113 Encounter for screening for infections with a predominantly sexual mode of transmission: Secondary | ICD-10-CM | POA: Diagnosis not present

## 2023-06-24 DIAGNOSIS — Z125 Encounter for screening for malignant neoplasm of prostate: Secondary | ICD-10-CM

## 2023-06-24 DIAGNOSIS — Z13220 Encounter for screening for lipoid disorders: Secondary | ICD-10-CM | POA: Diagnosis not present

## 2023-06-24 LAB — HEPATIC FUNCTION PANEL
ALT: 15 U/L (ref 0–53)
AST: 21 U/L (ref 0–37)
Albumin: 4.1 g/dL (ref 3.5–5.2)
Alkaline Phosphatase: 74 U/L (ref 39–117)
Bilirubin, Direct: 0.1 mg/dL (ref 0.0–0.3)
Total Bilirubin: 0.3 mg/dL (ref 0.2–1.2)
Total Protein: 6.5 g/dL (ref 6.0–8.3)

## 2023-06-24 LAB — LIPID PANEL
Cholesterol: 138 mg/dL (ref 0–200)
HDL: 39.1 mg/dL (ref >39.00)
LDL Cholesterol: 83 mg/dL (ref 0–99)
NonHDL: 99.21
Total CHOL/HDL Ratio: 4
Triglycerides: 83 mg/dL (ref 0.0–149.0)
VLDL: 16.6 mg/dL (ref 0.0–40.0)

## 2023-06-24 LAB — CBC WITH DIFFERENTIAL/PLATELET
Basophils Absolute: 0.1 10*3/uL (ref 0.0–0.1)
Basophils Relative: 1 % (ref 0.0–3.0)
Eosinophils Absolute: 0.2 10*3/uL (ref 0.0–0.7)
Eosinophils Relative: 4.2 % (ref 0.0–5.0)
HCT: 40.9 % (ref 39.0–52.0)
Hemoglobin: 13.5 g/dL (ref 13.0–17.0)
Lymphocytes Relative: 44.2 % (ref 12.0–46.0)
Lymphs Abs: 2.3 10*3/uL (ref 0.7–4.0)
MCHC: 32.9 g/dL (ref 30.0–36.0)
MCV: 90.7 fl (ref 78.0–100.0)
Monocytes Absolute: 0.5 10*3/uL (ref 0.1–1.0)
Monocytes Relative: 10.6 % (ref 3.0–12.0)
Neutro Abs: 2.1 10*3/uL (ref 1.4–7.7)
Neutrophils Relative %: 40 % — ABNORMAL LOW (ref 43.0–77.0)
Platelets: 206 10*3/uL (ref 150.0–400.0)
RBC: 4.52 Mil/uL (ref 4.22–5.81)
RDW: 13.7 % (ref 11.5–15.5)
WBC: 5.2 10*3/uL (ref 4.0–10.5)

## 2023-06-24 LAB — BASIC METABOLIC PANEL
BUN: 24 mg/dL — ABNORMAL HIGH (ref 6–23)
CO2: 26 mEq/L (ref 19–32)
Calcium: 9.3 mg/dL (ref 8.4–10.5)
Chloride: 108 mEq/L (ref 96–112)
Creatinine, Ser: 1.19 mg/dL (ref 0.40–1.50)
GFR: 73.22 mL/min (ref >60.00)
Glucose, Bld: 94 mg/dL (ref 70–99)
Potassium: 4 mEq/L (ref 3.5–5.1)
Sodium: 142 mEq/L (ref 135–145)

## 2023-06-24 LAB — HEMOGLOBIN A1C: Hgb A1c MFr Bld: 5.7 % (ref 4.6–6.5)

## 2023-06-30 NOTE — Progress Notes (Unsigned)
Jay Price T. Jay Szuch, MD, CAQ [facility] [address] [place], [address]  Phone: [phone]  FAX: [phone]  Jay Price. - 47 y.o. male  MRN [id number]  Date of Birth: 1976-07-17  Date: 07/01/2023  PCP: Hannah Beat, MD  Referral: Hannah Beat, MD  No chief complaint on file.  Patient Care Team: Hannah Beat, MD as PCP - General (Family Medicine) Subjective:   Jay Price. is a 47 y.o. pleasant patient who presents with the following:  Preventative Health Maintenance Visit:  Health Maintenance Summary Reviewed and updated, unless pt declines services.  Tobacco History Reviewed. Alcohol: No concerns, no excessive use Exercise Habits: Some activity, rec at least 30 mins 5 times a week STD concerns: no risk or activity to increase risk Drug Use: None  Annual flu - never Covid booster  Health Maintenance  Topic Date Due   COVID-19 Vaccine (3 - Pfizer risk series) 09/30/2020   INFLUENZA VACCINE  06/20/2023   Colonoscopy  05/02/2025   DTaP/Tdap/Td (2 - Td or Tdap) 01/09/2027   Hepatitis C Screening  Completed   HIV Screening  Completed   HPV VACCINES  Aged Out   Immunization History  Administered Date(s) Administered   PFIZER Comirnaty(Gray Top)Covid-19 Tri-Sucrose Vaccine 08/12/2020, 09/02/2020   Tdap 01/09/2017   Patient Active Problem List   Diagnosis Date Noted   Nephrolithiasis 02/05/2018    Past Medical History:  Diagnosis Date   Seizures (HCC)    Distantly, had about 5 seizures, usually associated with great fatigue and strain. Last 10 years ago. Tonic-clonic activity described. Post-ictal state.    Past Surgical History:  Procedure Laterality Date   COLONOSCOPY WITH PROPOFOL N/A 05/02/2022   Procedure: COLONOSCOPY WITH PROPOFOL;  Surgeon: Wyline Mood, MD;  Location: [facility] ENDOSCOPY;  Service: Gastroenterology;  Laterality: N/A;    Family History  Problem  Relation Age of Onset   Prostate cancer Neg Hx    Bladder Cancer Neg Hx    Kidney Stones Neg Hx     Social History   Social History Narrative   Not on file    Past Medical History, Surgical History, Social History, Family History, Problem List, Medications, and Allergies have been reviewed and updated if relevant.  Review of Systems: Pertinent positives are listed above.  Otherwise, a full 14 point review of systems has been done in full and it is negative except where it is noted positive.  Objective:   There were no vitals taken for this visit. Ideal Body Weight:    Ideal Body Weight:   No results found.    03/28/2022    8:27 AM 03/23/2021    8:28 AM 03/23/2021    8:27 AM 04/29/2019    9:07 AM 01/20/2018    8:36 AM  Depression screen PHQ 2/9  Decreased Interest 0 0 0 0 0  Down, Depressed, Hopeless 0 0 0 0 0  PHQ - 2 Score 0 0 0 0 0  Altered sleeping  1     Tired, decreased energy  1     Change in appetite  0     Feeling bad or failure about yourself   0     Trouble concentrating  1     Moving slowly or fidgety/restless  0     Suicidal thoughts  0     PHQ-9 Score  3     Difficult doing work/chores  Not difficult at all  GEN: well developed, well nourished, no acute distress Eyes: conjunctiva and lids normal, PERRLA, EOMI ENT: TM clear, nares clear, oral exam WNL Neck: supple, no lymphadenopathy, no thyromegaly, no JVD Pulm: clear to auscultation and percussion, respiratory effort normal CV: regular rate and rhythm, S1-S2, no murmur, rub or gallop, no bruits, peripheral pulses normal and symmetric, no cyanosis, clubbing, edema or varicosities GI: soft, non-tender; no hepatosplenomegaly, masses; active bowel sounds all quadrants GU: deferred Lymph: no cervical, axillary or inguinal adenopathy MSK: gait normal, muscle tone and strength WNL, no joint swelling, effusions, discoloration, crepitus  SKIN: clear, good turgor, color WNL, no rashes, lesions, or  ulcerations Neuro: normal mental status, normal strength, sensation, and motion Psych: alert; oriented to person, place and time, normally interactive and not anxious or depressed in appearance.  All labs reviewed with patient. Results for orders placed or performed in visit on 06/24/23  Trichomonas vaginalis, RNA   Specimen: Urine  Result Value Ref Range   Trichomonas vaginalis RNA NOT DETECTED NOT DETECTED  C. trachomatis/N. gonorrhoeae RNA   Specimen: Urine  Result Value Ref Range   C. trachomatis RNA, TMA NOT DETECTED NOT DETECTED   N. gonorrhoeae RNA, TMA NOT DETECTED NOT DETECTED  RPR  Result Value Ref Range   RPR Ser Ql NON-REACTIVE NON-REACTIVE  HIV Antibody (routine testing w rflx)  Result Value Ref Range   HIV 1&2 Ab, 4th Generation NON-REACTIVE NON-REACTIVE  PSA, Total with Reflex to PSA, Free  Result Value Ref Range   PSA, Total 1.4 < OR = 4.0 ng/mL  Lipid panel  Result Value Ref Range   Cholesterol 138 0 - 200 mg/dL   Triglycerides 16.1 0.0 - 149.0 mg/dL   HDL 09.60 >45.40 mg/dL   VLDL 98.1 0.0 - 19.1 mg/dL   LDL Cholesterol 83 0 - 99 mg/dL   Total CHOL/HDL Ratio 4    NonHDL 99.21   Hemoglobin A1c  Result Value Ref Range   Hgb A1c MFr Bld 5.7 4.6 - 6.5 %  Hepatic function panel  Result Value Ref Range   Total Bilirubin 0.3 0.2 - 1.2 mg/dL   Bilirubin, Direct 0.1 0.0 - 0.3 mg/dL   Alkaline Phosphatase 74 39 - 117 U/L   AST 21 0 - 37 U/L   ALT 15 0 - 53 U/L   Total Protein 6.5 6.0 - 8.3 g/dL   Albumin 4.1 3.5 - 5.2 g/dL  CBC with Differential/Platelet  Result Value Ref Range   WBC 5.2 4.0 - 10.5 K/uL   RBC 4.52 4.22 - 5.81 Mil/uL   Hemoglobin 13.5 13.0 - 17.0 g/dL   HCT 47.8 29.5 - 62.1 %   MCV 90.7 78.0 - 100.0 fl   MCHC 32.9 30.0 - 36.0 g/dL   RDW 30.8 65.7 - 84.6 %   Platelets 206.0 150.0 - 400.0 K/uL   Neutrophils Relative % 40.0 (L) 43.0 - 77.0 %   Lymphocytes Relative 44.2 12.0 - 46.0 %   Monocytes Relative 10.6 3.0 - 12.0 %   Eosinophils  Relative 4.2 0.0 - 5.0 %   Basophils Relative 1.0 0.0 - 3.0 %   Neutro Abs 2.1 1.4 - 7.7 K/uL   Lymphs Abs 2.3 0.7 - 4.0 K/uL   Monocytes Absolute 0.5 0.1 - 1.0 K/uL   Eosinophils Absolute 0.2 0.0 - 0.7 K/uL   Basophils Absolute 0.1 0.0 - 0.1 K/uL  Basic metabolic panel  Result Value Ref Range   Sodium 142 135 - 145 mEq/L   Potassium  4.0 3.5 - 5.1 mEq/L   Chloride 108 96 - 112 mEq/L   CO2 26 19 - 32 mEq/L   Glucose, Bld 94 70 - 99 mg/dL   BUN 24 (H) 6 - 23 mg/dL   Creatinine, Ser 0.98 0.40 - 1.50 mg/dL   GFR 11.91 >47.82 mL/min   Calcium 9.3 8.4 - 10.5 mg/dL    Assessment and Plan:     ICD-10-CM   1. Healthcare maintenance  Z00.00       Health Maintenance Exam: The patient's preventative maintenance and recommended screening tests for an annual wellness exam were reviewed in full today. Brought up to date unless services declined.  Counselled on the importance of diet, exercise, and its role in overall health and mortality. The patient's FH and SH was reviewed, including their home life, tobacco status, and drug and alcohol status.  Follow-up in 1 year for physical exam or additional follow-up below.  Disposition: No follow-ups on file.  No orders of the defined types were placed in this encounter.  Medications Discontinued During This Encounter  Medication Reason   tamsulosin (FLOMAX) 0.4 MG CAPS capsule    No orders of the defined types were placed in this encounter.   Signed,  Elpidio Galea. Jonerik Sliker, MD   Allergies as of 07/01/2023   No Known Allergies      Medication List        Accurate as of June 30, 2023  2:52 PM. If you have any questions, ask your nurse or doctor.          STOP taking these medications    tamsulosin 0.4 MG Caps capsule Commonly known as: FLOMAX

## 2023-07-01 ENCOUNTER — Encounter: Payer: Self-pay | Admitting: Family Medicine

## 2023-07-01 ENCOUNTER — Ambulatory Visit (INDEPENDENT_AMBULATORY_CARE_PROVIDER_SITE_OTHER): Payer: Commercial Managed Care - PPO | Admitting: Family Medicine

## 2023-07-01 VITALS — BP 122/78 | HR 58 | Temp 98.2°F | Ht 70.75 in | Wt 178.5 lb

## 2023-07-01 DIAGNOSIS — Z0000 Encounter for general adult medical examination without abnormal findings: Secondary | ICD-10-CM

## 2024-06-16 ENCOUNTER — Encounter: Payer: Self-pay | Admitting: Dermatology

## 2024-06-16 ENCOUNTER — Ambulatory Visit: Admitting: Dermatology

## 2024-06-16 DIAGNOSIS — D1801 Hemangioma of skin and subcutaneous tissue: Secondary | ICD-10-CM

## 2024-06-16 DIAGNOSIS — L814 Other melanin hyperpigmentation: Secondary | ICD-10-CM | POA: Diagnosis not present

## 2024-06-16 DIAGNOSIS — W908XXA Exposure to other nonionizing radiation, initial encounter: Secondary | ICD-10-CM

## 2024-06-16 DIAGNOSIS — L578 Other skin changes due to chronic exposure to nonionizing radiation: Secondary | ICD-10-CM | POA: Diagnosis not present

## 2024-06-16 DIAGNOSIS — Z1283 Encounter for screening for malignant neoplasm of skin: Secondary | ICD-10-CM | POA: Diagnosis not present

## 2024-06-16 DIAGNOSIS — L821 Other seborrheic keratosis: Secondary | ICD-10-CM

## 2024-06-16 DIAGNOSIS — D229 Melanocytic nevi, unspecified: Secondary | ICD-10-CM

## 2024-06-16 NOTE — Progress Notes (Signed)
   Follow-Up Visit   Subjective  Jay Price. is a 48 y.o. male who presents for the following: Skin Cancer Screening and Full Body Skin Exam  The patient presents for Total-Body Skin Exam (TBSE) for skin cancer screening and mole check. The patient has spots, moles and lesions to be evaluated, some may be new or changing and the patient may have concern these could be cancer.  Patient saw Dr. Hester in 2019 and had biopsies done. Will call [place] to get results. Patient does have some spots at back that he thinks are new and possibly bigger.   The following portions of the chart were reviewed this encounter and updated as appropriate: medications, allergies, medical history  Review of Systems:  No other skin or systemic complaints except as noted in HPI or Assessment and Plan.  Objective  Well appearing patient in no apparent distress; mood and affect are within normal limits.  A full examination was performed including scalp, head, eyes, ears, nose, lips, neck, chest, axillae, abdomen, back, buttocks, bilateral upper extremities, bilateral lower extremities, hands, feet, fingers, toes, fingernails, and toenails. All findings within normal limits unless otherwise noted below.   Relevant physical exam findings are noted in the Assessment and Plan.    Assessment & Plan   SKIN CANCER SCREENING PERFORMED TODAY.  ACTINIC DAMAGE - Chronic condition, secondary to cumulative UV/sun exposure - diffuse scaly erythematous macules with underlying dyspigmentation - Recommend daily broad spectrum sunscreen SPF 30+ to sun-exposed areas, reapply every 2 hours as needed.  - Staying in the shade or wearing long sleeves, sun glasses (UVA+UVB protection) and wide brim hats (4-inch brim around the entire circumference of the hat) are also recommended for sun protection.  - Call for new or changing lesions.  LENTIGINES, SEBORRHEIC KERATOSES, HEMANGIOMAS - Benign normal skin lesions -  Benign-appearing - Call for any changes - can treat angiomas with laser if desired. Cosmetic charge  MELANOCYTIC NEVI - Tan-brown and/or pink-flesh-colored symmetric macules and papules - Benign appearing on exam today - Observation - Call clinic for new or changing moles - Recommend daily use of broad spectrum spf 30+ sunscreen to sun-exposed areas.    MULTIPLE BENIGN NEVI   LENTIGINES   ACTINIC ELASTOSIS   SEBORRHEIC KERATOSES   CHERRY ANGIOMA   Return in about 1 year (around 06/16/2025) for TBSE, with Dr. Claudene.  LILLETTE Lonell Drones, RMA, am acting as scribe for Boneta Claudene, MD .   Documentation: I have reviewed the above documentation for accuracy and completeness, and I agree with the above.  Boneta Claudene, MD

## 2024-06-16 NOTE — Patient Instructions (Signed)

## 2024-07-05 NOTE — Progress Notes (Signed)
 "    Jay Price T. Prarthana Parlin, MD, CAQ Sports Medicine [facility] at [facility] [address] [place], [address]  Phone: [phone]  FAX: [phone]  Adolph Clutter. - 48 y.o. male  MRN [id number]  Date of Birth: [birth year]  Date: 07/06/2024  PCP: Watt Mirza, MD  Referral: Watt Mirza, MD  Chief Complaint  Patient presents with   Annual Exam   Patient Care Team: Watt Mirza, MD as PCP - General (Family Medicine) Subjective:   Jay JONETTA Albertina Mickey. is a 48 y.o. pleasant patient who presents with the following:  Discussed the use of AI scribe software for clinical note transcription with the patient, who gave verbal consent to proceed.  Had an issue with bleeding in his mouth Some occ dysphagia  Leg fasculations - cramping in the foot  Question possible cts Worries about ALS Worres about muscle fasiculations  Concern slurred speech Happened a lot of word finding issues  Had one fall Worries about neurological conditions Blacked out for a second Seizures as a child  Worries about spitting up blood  STD Check RPR HIV B12 CMP CBC TSH Lipid    History of Present Illness Jay Price. is a 48 year old male who presents with issues swallowing, spitting out blood, and neurological symptoms.  He experiences difficulty swallowing and occasionally spits out blood. He has no history of tobacco use, including smoking or chewing tobacco.  He does not believe that this is coming from his nose or his sinuses.  He also notes that he has some difficulty swallowing medications and or food at times.  He experiences neurological symptoms such as tingling in his hands and feet, slurred speech, and difficulty finding words. He describes his speech issue as more of a stutter and notes that he sometimes has to 'recalibrate' his thoughts. He has experienced one fall in the past, approximately three to four years ago, where he blacked  out at work. No recent falls or significant changes in his daily activities, including workouts. No headaches or blurred vision.  He has had seizures in the past, but he did not have any kind of postictal state with any of these issues.  He urinates more frequently than average and maintains a consistent diet. He reports occasional cramps in his feet, which he manages by being careful and stretching to prevent them from worsening.  He mentions a patch on his leg that itches and feels warm to the touch. He was previously given a cream for it, which did not resolve the issue. The patch has been present for years and was initially evaluated in [place] , where a biopsy was suggested but not performed due to logistical issues.  Preventative Health Maintenance Visit:  Health Maintenance Summary Reviewed and updated, unless pt declines services.  Tobacco History Reviewed. Alcohol: No concerns, no excessive use Exercise Habits: Some activity, rec at least 30 mins 5 times a week -he is doing extensive workouts about 5 days a week STD concerns: no risk or activity to increase risk Drug Use: None  He does not get a flu shot Consider COVID booster  Health Maintenance  Topic Date Due   Hepatitis B Vaccines 19-59 Average Risk (1 of 3 - 19+ 3-dose series) Never done   COVID-19 Vaccine (3 - Pfizer risk series) 09/30/2020   INFLUENZA VACCINE  02/16/2025 (Originally 06/19/2024)   Colonoscopy  05/02/2025   DTaP/Tdap/Td (2 - Td or Tdap) 01/09/2027   Hepatitis C Screening  Completed   HIV Screening  Completed   Pneumococcal Vaccine  Aged Out   HPV VACCINES  Aged Out   Meningococcal B Vaccine  Aged Out   Immunization History  Administered Date(s) Administered   PFIZER Comirnaty(Gray Top)Covid-19 Tri-Sucrose Vaccine 08/12/2020, 09/02/2020   Tdap 01/09/2017   Patient Active Problem List   Diagnosis Date Noted   Nephrolithiasis 02/05/2018    Past Medical History:  Diagnosis Date   Dysplastic nevus  09/15/2018   left low back 9.5 cm lat to spine, moderate atypia   Dysplastic nevus 09/15/2018   left upper back paraspinal, mild atypia   Seizures (HCC)    Distantly, had about 5 seizures, usually associated with great fatigue and strain. Last 10 years ago. Tonic-clonic activity described. Post-ictal state.    Past Surgical History:  Procedure Laterality Date   COLONOSCOPY WITH PROPOFOL  N/A 05/02/2022   Procedure: COLONOSCOPY WITH PROPOFOL ;  Surgeon: Therisa Bi, MD;  Location: [facility] ENDOSCOPY;  Service: Gastroenterology;  Laterality: N/A;    Family History  Problem Relation Age of Onset   Prostate cancer Neg Hx    Bladder Cancer Neg Hx    Kidney Stones Neg Hx     Social History   Social History Narrative   Not on file    Past Medical History, Surgical History, Social History, Family History, Problem List, Medications, and Allergies have been reviewed and updated if relevant.  Review of Systems: Pertinent positives are listed above.  Otherwise, a full 14 point review of systems has been done in full and it is negative except where it is noted positive.  Objective:   BP 112/70   Pulse (!) 56   Temp 98.9 F (37.2 C) (Temporal)   Ht 5' 11 (1.803 m)   Wt 175 lb 8 oz (79.6 kg)   SpO2 98%   BMI 24.48 kg/m  Ideal Body Weight: Weight in (lb) to have BMI = 25: 178.9  Ideal Body Weight: Weight in (lb) to have BMI = 25: 178.9 No results found.    07/06/2024    2:45 PM 07/01/2023    2:17 PM 03/28/2022    8:27 AM 03/23/2021    8:28 AM 03/23/2021    8:27 AM  Depression screen PHQ 2/9  Decreased Interest 0 0 0 0 0  Down, Depressed, Hopeless 0 0 0 0 0  PHQ - 2 Score 0 0 0 0 0  Altered sleeping    1   Tired, decreased energy    1   Change in appetite    0   Feeling bad or failure about yourself     0   Trouble concentrating    1   Moving slowly or fidgety/restless    0   Suicidal thoughts    0   PHQ-9 Score    3   Difficult doing work/chores    Not difficult at all       GEN: well developed, well nourished, no acute distress Eyes: conjunctiva and lids normal, PERRLA, EOMI ENT: TM clear, nares clear, oral exam WNL Neck: supple, no lymphadenopathy, no thyromegaly, no JVD Pulm: clear to auscultation and percussion, respiratory effort normal CV: regular rate and rhythm, S1-S2, no murmur, rub or gallop, no bruits, peripheral pulses normal and symmetric, no cyanosis, clubbing, edema or varicosities GI: soft, non-tender; no hepatosplenomegaly, masses; active bowel sounds all quadrants GU: deferred Lymph: no cervical, axillary or inguinal adenopathy MSK: gait normal, muscle tone and strength WNL, no joint swelling, effusions, discoloration,  crepitus  SKIN: clear, good turgor, color WNL, no rashes, lesions, or ulcerations Psych: alert; oriented to person, place and time, normally interactive and not anxious or depressed in appearance.   Neuro: CN 2-12 grossly intact. PERRLA. EOMI. Sensation intact throughout. Str 5/5 all extremities. DTR 2+. No clonus. A and o x 4. Romberg neg. Finger nose neg. Heel -shin neg. heel toe walking normal No nystagmus   Physical Exam   All labs reviewed with patient. Results for orders placed or performed in visit on 06/24/23  Trichomonas vaginalis, RNA   Collection Time: 06/24/23  7:37 AM   Specimen: Urine  Result Value Ref Range   Trichomonas vaginalis RNA NOT DETECTED NOT DETECTED  C. trachomatis/N. gonorrhoeae RNA   Collection Time: 06/24/23  7:37 AM   Specimen: Urine  Result Value Ref Range   C. trachomatis RNA, TMA NOT DETECTED NOT DETECTED   N. gonorrhoeae RNA, TMA NOT DETECTED NOT DETECTED  RPR   Collection Time: 06/24/23  7:37 AM  Result Value Ref Range   RPR Ser Ql NON-REACTIVE NON-REACTIVE  HIV Antibody (routine testing w rflx)   Collection Time: 06/24/23  7:37 AM  Result Value Ref Range   HIV 1&2 Ab, 4th Generation NON-REACTIVE NON-REACTIVE  PSA, Total with Reflex to PSA, Free   Collection Time:  06/24/23  7:37 AM  Result Value Ref Range   PSA, Total 1.4 < OR = 4.0 ng/mL  Lipid panel   Collection Time: 06/24/23  7:37 AM  Result Value Ref Range   Cholesterol 138 0 - 200 mg/dL   Triglycerides 16.9 0.0 - 149.0 mg/dL   HDL 60.89 >60.99 mg/dL   VLDL 83.3 0.0 - 59.9 mg/dL   LDL Cholesterol 83 0 - 99 mg/dL   Total CHOL/HDL Ratio 4    NonHDL 99.21   Hemoglobin A1c   Collection Time: 06/24/23  7:37 AM  Result Value Ref Range   Hgb A1c MFr Bld 5.7 4.6 - 6.5 %  Hepatic function panel   Collection Time: 06/24/23  7:37 AM  Result Value Ref Range   Total Bilirubin 0.3 0.2 - 1.2 mg/dL   Bilirubin, Direct 0.1 0.0 - 0.3 mg/dL   Alkaline Phosphatase 74 39 - 117 U/L   AST 21 0 - 37 U/L   ALT 15 0 - 53 U/L   Total Protein 6.5 6.0 - 8.3 g/dL   Albumin 4.1 3.5 - 5.2 g/dL  CBC with Differential/Platelet   Collection Time: 06/24/23  7:37 AM  Result Value Ref Range   WBC 5.2 4.0 - 10.5 K/uL   RBC 4.52 4.22 - 5.81 Mil/uL   Hemoglobin 13.5 13.0 - 17.0 g/dL   HCT 59.0 60.9 - 47.9 %   MCV 90.7 78.0 - 100.0 fl   MCHC 32.9 30.0 - 36.0 g/dL   RDW 86.2 88.4 - 84.4 %   Platelets 206.0 150.0 - 400.0 K/uL   Neutrophils Relative % 40.0 (L) 43.0 - 77.0 %   Lymphocytes Relative 44.2 12.0 - 46.0 %   Monocytes Relative 10.6 3.0 - 12.0 %   Eosinophils Relative 4.2 0.0 - 5.0 %   Basophils Relative 1.0 0.0 - 3.0 %   Neutro Abs 2.1 1.4 - 7.7 K/uL   Lymphs Abs 2.3 0.7 - 4.0 K/uL   Monocytes Absolute 0.5 0.1 - 1.0 K/uL   Eosinophils Absolute 0.2 0.0 - 0.7 K/uL   Basophils Absolute 0.1 0.0 - 0.1 K/uL  Basic metabolic panel   Collection Time: 06/24/23  7:37  AM  Result Value Ref Range   Sodium 142 135 - 145 mEq/L   Potassium 4.0 3.5 - 5.1 mEq/L   Chloride 108 96 - 112 mEq/L   CO2 26 19 - 32 mEq/L   Glucose, Bld 94 70 - 99 mg/dL   BUN 24 (H) 6 - 23 mg/dL   Creatinine, Ser 8.80 0.40 - 1.50 mg/dL   GFR 26.77 >39.99 mL/min   Calcium 9.3 8.4 - 10.5 mg/dL    Assessment and Plan:     ICD-10-CM   1.  Healthcare maintenance  Z00.00     2. Oral phase dysphagia  R13.11 Ambulatory referral to Gastroenterology    HIV Antibody (routine testing w rflx)    Trichomonas vaginalis, RNA    RPR    C. trachomatis/N. gonorrhoeae RNA    CBC with Differential/Platelet    Basic metabolic panel with GFR    Hepatic function panel    Vitamin B12    TSH    T4, free    T3, free    VITAMIN D  25 Hydroxy (Vit-D Deficiency, Fractures)    Hepatitis C antibody    MR Brain W Wo Contrast    HSV(herpes simplex vrs) 1+2 ab-IgG    3. Spitting blood  R04.2 Ambulatory referral to Gastroenterology    HIV Antibody (routine testing w rflx)    Trichomonas vaginalis, RNA    RPR    C. trachomatis/N. gonorrhoeae RNA    CBC with Differential/Platelet    Basic metabolic panel with GFR    Hepatic function panel    Vitamin B12    TSH    T4, free    T3, free    VITAMIN D  25 Hydroxy (Vit-D Deficiency, Fractures)    Hepatitis C antibody    MR Brain W Wo Contrast    HSV(herpes simplex vrs) 1+2 ab-IgG    4. Aphasia  R47.01 HIV Antibody (routine testing w rflx)    Trichomonas vaginalis, RNA    RPR    C. trachomatis/N. gonorrhoeae RNA    CBC with Differential/Platelet    Basic metabolic panel with GFR    Hepatic function panel    Vitamin B12    TSH    T4, free    T3, free    VITAMIN D  25 Hydroxy (Vit-D Deficiency, Fractures)    Hepatitis C antibody    MR Brain W Wo Contrast    HSV(herpes simplex vrs) 1+2 ab-IgG    5. Word finding difficulty  R47.89 HIV Antibody (routine testing w rflx)    Trichomonas vaginalis, RNA    RPR    C. trachomatis/N. gonorrhoeae RNA    CBC with Differential/Platelet    Basic metabolic panel with GFR    Hepatic function panel    Vitamin B12    TSH    T4, free    T3, free    VITAMIN D  25 Hydroxy (Vit-D Deficiency, Fractures)    Hepatitis C antibody    MR Brain W Wo Contrast    HSV(herpes simplex vrs) 1+2 ab-IgG    6. Numbness and tingling of left upper and lower extremity   R20.0 HIV Antibody (routine testing w rflx)   R20.2 Trichomonas vaginalis, RNA    RPR    C. trachomatis/N. gonorrhoeae RNA    CBC with Differential/Platelet    Basic metabolic panel with GFR    Hepatic function panel    Vitamin B12    TSH    T4, free    T3, free  VITAMIN D  25 Hydroxy (Vit-D Deficiency, Fractures)    Hepatitis C antibody    MR Brain W Wo Contrast    HSV(herpes simplex vrs) 1+2 ab-IgG    7. Fall, subsequent encounter  W19.XXXD HIV Antibody (routine testing w rflx)    Trichomonas vaginalis, RNA    RPR    C. trachomatis/N. gonorrhoeae RNA    CBC with Differential/Platelet    Basic metabolic panel with GFR    Hepatic function panel    Vitamin B12    TSH    T4, free    T3, free    VITAMIN D  25 Hydroxy (Vit-D Deficiency, Fractures)    Hepatitis C antibody    MR Brain W Wo Contrast    HSV(herpes simplex vrs) 1+2 ab-IgG    8. Black-out (not amnesia)  R55 HIV Antibody (routine testing w rflx)    Trichomonas vaginalis, RNA    RPR    C. trachomatis/N. gonorrhoeae RNA    CBC with Differential/Platelet    Basic metabolic panel with GFR    Hepatic function panel    Vitamin B12    TSH    T4, free    T3, free    VITAMIN D  25 Hydroxy (Vit-D Deficiency, Fractures)    Hepatitis C antibody    MR Brain W Wo Contrast    HSV(herpes simplex vrs) 1+2 ab-IgG    9. Fasciculations of muscle  R25.3 HIV Antibody (routine testing w rflx)    Trichomonas vaginalis, RNA    RPR    C. trachomatis/N. gonorrhoeae RNA    CBC with Differential/Platelet    Basic metabolic panel with GFR    Hepatic function panel    Vitamin B12    TSH    T4, free    T3, free    VITAMIN D  25 Hydroxy (Vit-D Deficiency, Fractures)    Hepatitis C antibody    MR Brain W Wo Contrast    HSV(herpes simplex vrs) 1+2 ab-IgG    10. Screening, lipid  Z13.220 Lipid panel    11. Screening for diabetes mellitus  Z13.1 Hepatic function panel    Hemoglobin A1c    12. Screening for prostate cancer  Z12.5  PSA, Total with Reflex to PSA, Free      Assessment and Plan Assessment & Plan Adult Wellness Visit Routine wellness visit with discussion of symptoms and potential underlying causes. - Order PSA, B12, syphilis, HIV, and thyroid function tests. - Order fasting labs for cholesterol. - Consider COVID vaccination.  Declined flu vaccination.  Multiple ongoing symptoms and concerns about his health over and above health maintenance:  Dysphagia and hemoptysis Intermittent dysphagia and hemoptysis with differential including esophageal stricture, tongue cancer, or throat cancer. Further investigation warranted. - Refer to GI for possible endoscopy.  Neurological symptoms: muscle cramps, paresthesia, speech disturbance, and fasciculations Multiple neurological symptoms with differential including B12 deficiency, thyroid issues, MS, ALS, or stroke. Discussed MS and need for MRI with contrast to rule out tumors or demyelinating diseases. - Order MRI of the brain with and without contrast to evaluate for neoplasm, stroke demyelinating disease - Check B12 levels. - Check thyroid function. Additional labs as above  Syncope and fall Previous fall with blackout episode 3-4 years ago. No recent episodes. Possible underlying neurological issues, but no immediate concerns.     Health Maintenance Exam: The patient's preventative maintenance and recommended screening tests for an annual wellness exam were reviewed in full today. Brought up to date unless services declined.  Counselled  on the importance of diet, exercise, and its role in overall health and mortality. The patient's FH and SH was reviewed, including their home life, tobacco status, and drug and alcohol status.  Follow-up in 1 year for physical exam or additional follow-up below.  Disposition: Return for AM Blood draw.  No orders of the defined types were placed in this encounter.  There are no discontinued medications. Orders  Placed This Encounter  Procedures   Trichomonas vaginalis, RNA   C. trachomatis/N. gonorrhoeae RNA   MR Brain W Wo Contrast   HIV Antibody (routine testing w rflx)   RPR   CBC with Differential/Platelet   Basic metabolic panel with GFR   Hepatic function panel   Lipid panel   Vitamin B12   TSH   T4, free   T3, free   VITAMIN D  25 Hydroxy (Vit-D Deficiency, Fractures)   Hepatitis C antibody   PSA, Total with Reflex to PSA, Free   HSV(herpes simplex vrs) 1+2 ab-IgG   Hemoglobin A1c   Ambulatory referral to Gastroenterology    Signed,  Jacques T. Deontaye Civello, MD   Allergies as of 07/06/2024   No Known Allergies      Medication List    as of July 06, 2024 11:59 PM   You have not been prescribed any medications.    "

## 2024-07-06 ENCOUNTER — Encounter: Payer: Self-pay | Admitting: Family Medicine

## 2024-07-06 ENCOUNTER — Ambulatory Visit: Admitting: Family Medicine

## 2024-07-06 VITALS — BP 112/70 | HR 56 | Temp 98.9°F | Ht 71.0 in | Wt 175.5 lb

## 2024-07-06 DIAGNOSIS — W19XXXD Unspecified fall, subsequent encounter: Secondary | ICD-10-CM

## 2024-07-06 DIAGNOSIS — Z0000 Encounter for general adult medical examination without abnormal findings: Secondary | ICD-10-CM

## 2024-07-06 DIAGNOSIS — R1311 Dysphagia, oral phase: Secondary | ICD-10-CM

## 2024-07-06 DIAGNOSIS — Z13220 Encounter for screening for lipoid disorders: Secondary | ICD-10-CM

## 2024-07-06 DIAGNOSIS — R253 Fasciculation: Secondary | ICD-10-CM

## 2024-07-06 DIAGNOSIS — R202 Paresthesia of skin: Secondary | ICD-10-CM

## 2024-07-06 DIAGNOSIS — Z125 Encounter for screening for malignant neoplasm of prostate: Secondary | ICD-10-CM

## 2024-07-06 DIAGNOSIS — R4789 Other speech disturbances: Secondary | ICD-10-CM

## 2024-07-06 DIAGNOSIS — R4701 Aphasia: Secondary | ICD-10-CM

## 2024-07-06 DIAGNOSIS — R55 Syncope and collapse: Secondary | ICD-10-CM

## 2024-07-06 DIAGNOSIS — R200 Anesthesia of skin: Secondary | ICD-10-CM

## 2024-07-06 DIAGNOSIS — Z0001 Encounter for general adult medical examination with abnormal findings: Secondary | ICD-10-CM | POA: Diagnosis not present

## 2024-07-06 DIAGNOSIS — R042 Hemoptysis: Secondary | ICD-10-CM

## 2024-07-06 DIAGNOSIS — Z131 Encounter for screening for diabetes mellitus: Secondary | ICD-10-CM

## 2024-07-09 ENCOUNTER — Other Ambulatory Visit (INDEPENDENT_AMBULATORY_CARE_PROVIDER_SITE_OTHER)

## 2024-07-09 DIAGNOSIS — R4789 Other speech disturbances: Secondary | ICD-10-CM

## 2024-07-09 DIAGNOSIS — Z131 Encounter for screening for diabetes mellitus: Secondary | ICD-10-CM | POA: Diagnosis not present

## 2024-07-09 DIAGNOSIS — R202 Paresthesia of skin: Secondary | ICD-10-CM

## 2024-07-09 DIAGNOSIS — R4701 Aphasia: Secondary | ICD-10-CM

## 2024-07-09 DIAGNOSIS — Z13220 Encounter for screening for lipoid disorders: Secondary | ICD-10-CM | POA: Diagnosis not present

## 2024-07-09 DIAGNOSIS — R1311 Dysphagia, oral phase: Secondary | ICD-10-CM

## 2024-07-09 DIAGNOSIS — W19XXXD Unspecified fall, subsequent encounter: Secondary | ICD-10-CM

## 2024-07-09 DIAGNOSIS — R253 Fasciculation: Secondary | ICD-10-CM

## 2024-07-09 DIAGNOSIS — R042 Hemoptysis: Secondary | ICD-10-CM

## 2024-07-09 DIAGNOSIS — Z125 Encounter for screening for malignant neoplasm of prostate: Secondary | ICD-10-CM

## 2024-07-09 DIAGNOSIS — R55 Syncope and collapse: Secondary | ICD-10-CM

## 2024-07-09 DIAGNOSIS — R200 Anesthesia of skin: Secondary | ICD-10-CM

## 2024-07-09 LAB — HEPATIC FUNCTION PANEL
ALT: 19 U/L (ref 0–53)
AST: 18 U/L (ref 0–37)
Albumin: 4.6 g/dL (ref 3.5–5.2)
Alkaline Phosphatase: 68 U/L (ref 39–117)
Bilirubin, Direct: 0.1 mg/dL (ref 0.0–0.3)
Total Bilirubin: 0.7 mg/dL (ref 0.2–1.2)
Total Protein: 7.3 g/dL (ref 6.0–8.3)

## 2024-07-09 LAB — LIPID PANEL
Cholesterol: 158 mg/dL (ref 0–200)
HDL: 45.7 mg/dL (ref >39.00)
LDL Cholesterol: 95 mg/dL (ref 0–99)
NonHDL: 111.86
Total CHOL/HDL Ratio: 3
Triglycerides: 82 mg/dL (ref 0.0–149.0)
VLDL: 16.4 mg/dL (ref 0.0–40.0)

## 2024-07-09 LAB — VITAMIN B12: Vitamin B-12: 428 pg/mL (ref 211–911)

## 2024-07-09 LAB — BASIC METABOLIC PANEL WITH GFR
BUN: 24 mg/dL — ABNORMAL HIGH (ref 6–23)
CO2: 29 meq/L (ref 19–32)
Calcium: 9.5 mg/dL (ref 8.4–10.5)
Chloride: 102 meq/L (ref 96–112)
Creatinine, Ser: 1.31 mg/dL (ref 0.40–1.50)
GFR: 64.77 mL/min (ref >60.00)
Glucose, Bld: 92 mg/dL (ref 70–99)
Potassium: 4.3 meq/L (ref 3.5–5.1)
Sodium: 140 meq/L (ref 135–145)

## 2024-07-09 LAB — CBC WITH DIFFERENTIAL/PLATELET
Basophils Absolute: 0 K/uL (ref 0.0–0.1)
Basophils Relative: 0.7 % (ref 0.0–3.0)
Eosinophils Absolute: 0.2 K/uL (ref 0.0–0.7)
Eosinophils Relative: 3.8 % (ref 0.0–5.0)
HCT: 44 % (ref 39.0–52.0)
Hemoglobin: 14.7 g/dL (ref 13.0–17.0)
Lymphocytes Relative: 31 % (ref 12.0–46.0)
Lymphs Abs: 1.7 K/uL (ref 0.7–4.0)
MCHC: 33.3 g/dL (ref 30.0–36.0)
MCV: 90.6 fl (ref 78.0–100.0)
Monocytes Absolute: 0.6 K/uL (ref 0.1–1.0)
Monocytes Relative: 10.1 % (ref 3.0–12.0)
Neutro Abs: 3 K/uL (ref 1.4–7.7)
Neutrophils Relative %: 54.4 % (ref 43.0–77.0)
Platelets: 232 K/uL (ref 150.0–400.0)
RBC: 4.86 Mil/uL (ref 4.22–5.81)
RDW: 13 % (ref 11.5–15.5)
WBC: 5.5 K/uL (ref 4.0–10.5)

## 2024-07-09 LAB — VITAMIN D 25 HYDROXY (VIT D DEFICIENCY, FRACTURES): VITD: 19.4 ng/mL — ABNORMAL LOW (ref 30.00–100.00)

## 2024-07-09 LAB — T3, FREE: T3, Free: 3.3 pg/mL (ref 2.3–4.2)

## 2024-07-09 LAB — T4, FREE: Free T4: 0.87 ng/dL (ref 0.60–1.60)

## 2024-07-09 LAB — TSH: TSH: 1.73 u[IU]/mL (ref 0.35–5.50)

## 2024-07-09 LAB — HEMOGLOBIN A1C: Hgb A1c MFr Bld: 5.9 % (ref 4.6–6.5)

## 2024-07-10 ENCOUNTER — Ambulatory Visit: Payer: Self-pay | Admitting: Family Medicine

## 2024-07-10 DIAGNOSIS — R200 Anesthesia of skin: Secondary | ICD-10-CM

## 2024-07-10 DIAGNOSIS — R253 Fasciculation: Secondary | ICD-10-CM

## 2024-07-10 DIAGNOSIS — R4701 Aphasia: Secondary | ICD-10-CM

## 2024-07-10 DIAGNOSIS — R4789 Other speech disturbances: Secondary | ICD-10-CM

## 2024-07-13 LAB — HSV(HERPES SIMPLEX VRS) I + II AB-IGG
HSV 1 IGG,TYPE SPECIFIC AB: 1.04 {index} — ABNORMAL HIGH
HSV 2 IGG,TYPE SPECIFIC AB: <0.9 {index}

## 2024-07-13 LAB — RPR: RPR Ser Ql: NONREACTIVE

## 2024-07-13 LAB — PSA, TOTAL WITH REFLEX TO PSA, FREE: PSA, Total: 1.2 ng/mL (ref <=4.0)

## 2024-07-13 LAB — TRICHOMONAS VAGINALIS, PROBE AMP: Trichomonas vaginalis RNA: NOT DETECTED

## 2024-07-13 LAB — C. TRACHOMATIS/N. GONORRHOEAE RNA
C. trachomatis RNA, TMA: NOT DETECTED
N. gonorrhoeae RNA, TMA: NOT DETECTED

## 2024-07-13 LAB — HIV ANTIBODY (ROUTINE TESTING W REFLEX): HIV 1&2 Ab, 4th Generation: NONREACTIVE

## 2024-07-13 LAB — HEPATITIS C ANTIBODY: Hepatitis C Ab: NONREACTIVE

## 2024-08-03 ENCOUNTER — Encounter: Payer: Self-pay | Admitting: *Deleted

## 2024-09-16 ENCOUNTER — Ambulatory Visit
Admission: RE | Admit: 2024-09-16 | Discharge: 2024-09-16 | Disposition: A | Discharge status: Home / Self Care | Source: Ambulatory Visit | Attending: Family Medicine | Admitting: Family Medicine

## 2024-09-16 DIAGNOSIS — R55 Syncope and collapse: Secondary | ICD-10-CM | POA: Diagnosis not present

## 2024-09-16 DIAGNOSIS — R253 Fasciculation: Secondary | ICD-10-CM | POA: Diagnosis not present

## 2024-09-16 DIAGNOSIS — X58XXXD Exposure to other specified factors, subsequent encounter: Secondary | ICD-10-CM | POA: Insufficient documentation

## 2024-09-16 DIAGNOSIS — R1311 Dysphagia, oral phase: Secondary | ICD-10-CM | POA: Insufficient documentation

## 2024-09-16 DIAGNOSIS — R200 Anesthesia of skin: Secondary | ICD-10-CM | POA: Diagnosis not present

## 2024-09-16 DIAGNOSIS — R042 Hemoptysis: Secondary | ICD-10-CM | POA: Diagnosis not present

## 2024-09-16 DIAGNOSIS — R4789 Other speech disturbances: Secondary | ICD-10-CM | POA: Insufficient documentation

## 2024-09-16 DIAGNOSIS — W19XXXD Unspecified fall, subsequent encounter: Secondary | ICD-10-CM | POA: Diagnosis not present

## 2024-09-16 DIAGNOSIS — R202 Paresthesia of skin: Secondary | ICD-10-CM | POA: Insufficient documentation

## 2024-09-16 DIAGNOSIS — R4701 Aphasia: Secondary | ICD-10-CM | POA: Insufficient documentation

## 2024-09-16 MED ORDER — GADOBUTROL 1 MMOL/ML IV SOLN
8.0000 mL | Freq: Once | INTRAVENOUS | Status: AC | PRN
  Administered 2024-09-16: 8 mL via INTRAVENOUS

## 2024-09-26 NOTE — Addendum Note (Signed)
 Addended by: WATT MIRZA on: 09/26/2024 08:19 AM   Modules accepted: Orders

## 2024-09-30 ENCOUNTER — Encounter: Payer: Self-pay | Admitting: Internal Medicine

## 2024-10-05 ENCOUNTER — Ambulatory Visit: Admitting: Diagnostic Neuroimaging

## 2024-10-05 ENCOUNTER — Encounter: Payer: Self-pay | Admitting: Diagnostic Neuroimaging

## 2024-10-05 VITALS — BP 139/85 | HR 57 | Ht 71.0 in | Wt 178.0 lb

## 2024-10-05 DIAGNOSIS — R55 Syncope and collapse: Secondary | ICD-10-CM | POA: Diagnosis not present

## 2024-10-05 DIAGNOSIS — R200 Anesthesia of skin: Secondary | ICD-10-CM | POA: Diagnosis not present

## 2024-10-05 DIAGNOSIS — R253 Fasciculation: Secondary | ICD-10-CM

## 2024-10-05 DIAGNOSIS — G40909 Epilepsy, unspecified, not intractable, without status epilepticus: Secondary | ICD-10-CM

## 2024-10-05 NOTE — Patient Instructions (Signed)
  INTERMITTENT MUSCLE TWITCHING / CRAMPS / NUMBNESS - check EMG/NCS   WORD FINDING DIFF - try to improve sleep hygiene  RECURRENT SYNCOPE / LOC EVENTS (last event May 2025) - check EEG - refer to cardiology consult  HISTORY OF SEIZURES (last event age 48 years old) - previously on dilantin; last event 48 years old - check EEG

## 2024-10-05 NOTE — Progress Notes (Signed)
 [facility]  PATIENT: Jay Price Jay Price. DOB: 04/16/1976  REFERRING CLINICIAN: Watt Mirza, MD HISTORY FROM: Patient REASON FOR VISIT: New consult   HISTORICAL  CHIEF COMPLAINT:  Chief Complaint  Patient presents with   RM 6    Consult: aphasia, numbness and tingling BUE, R>L; hx of seizures in childhood; hx of head trauma ~28yrs ago; alone    HISTORY OF PRESENT ILLNESS:   48 year old male here for evaluation of constellation of symptoms since 2023.  About 1 year ago noticed some intermittent muscle twitching in the calves, been in the arms, randomly occurring.  Sometimes these are associated with cramps sensation.  Also has had some intermittent migratory numbness sensation.  Also for the past 1 to 2 years has noted some word finding difficulties that is random and intermittent.  Patient has history of seizures from teenage years up to 48 years old.  Was on Dilantin.  Last generalized seizure was at age 33 years old.  No recurrent seizures that he knows of.  However has had some other intermittent episodes of unprovoked loss of consciousness without prodromal warning, last occurring around 2010 and May 2025.  Patient has some chronic sleep deprivation issues, mainly related to working 2 jobs.  He comes home around midnight, goes to sleep around 1 in the morning, then wakes up around 6:30 in the morning.  He may wake up during the night to use the bathroom as well.  Patient is still able to exercise and workout.  This does not seem to trigger any symptoms.   REVIEW OF SYSTEMS: Full 14 system review of systems performed and negative with exception of: as per HPI.  ALLERGIES: No Known Allergies  HOME MEDICATIONS: No outpatient medications prior to visit.   No facility-administered medications prior to visit.    PAST MEDICAL HISTORY: Past Medical History:  Diagnosis Date   Dysplastic nevus 09/15/2018   left low back 9.5 cm lat to spine, moderate  atypia   Dysplastic nevus 09/15/2018   left upper back paraspinal, mild atypia   Seizures (HCC)    Distantly, had about 5 seizures, usually associated with great fatigue and strain. Last 10 years ago. Tonic-clonic activity described. Post-ictal state.    PAST SURGICAL HISTORY: Past Surgical History:  Procedure Laterality Date   COLONOSCOPY WITH PROPOFOL  N/A 05/02/2022   Procedure: COLONOSCOPY WITH PROPOFOL ;  Surgeon: Therisa Bi, MD;  Location: [facility] ENDOSCOPY;  Service: Gastroenterology;  Laterality: N/A;    FAMILY HISTORY: Family History  Problem Relation Age of Onset   Prostate cancer Neg Hx    Bladder Cancer Neg Hx    Kidney Stones Neg Hx     SOCIAL HISTORY: Social History   Socioeconomic History   Marital status: Divorced    Spouse name: Not on file   Number of children: Not on file   Years of education: Not on file   Highest education level: Not on file  Occupational History   Not on file  Tobacco Use   Smoking status: Never   Smokeless tobacco: Never  Vaping Use   Vaping status: Never Used  Substance and Sexual Activity   Alcohol use: Yes    Alcohol/week: 0.0 standard drinks of alcohol    Comment: Socially    Drug use: No   Sexual activity: Yes    Birth control/protection: None  Other Topics Concern   Not on file  Social History Narrative   Not on file   Social Drivers of Health  Financial Resource Strain: Not on file  Food Insecurity: Not on file  Transportation Needs: Not on file  Physical Activity: Not on file  Stress: Not on file  Social Connections: Not on file  Intimate Partner Violence: Not on file     PHYSICAL EXAM  GENERAL EXAM/CONSTITUTIONAL: Vitals:  Vitals:   10/05/24 1534  BP: 139/85  Pulse: (!) 57  Weight: 178 lb (80.7 kg)  Height: 5' 11 (1.803 m)   Body mass index is 24.83 kg/m. Wt Readings from Last 3 Encounters:  10/05/24 178 lb (80.7 kg)  07/06/24 175 lb 8 oz (79.6 kg)  07/01/23 178 lb 8 oz (81 kg)   Patient is  in no distress; well developed, nourished and groomed; neck is supple  CARDIOVASCULAR: Examination of carotid arteries is normal; no carotid bruits Regular rate and rhythm, no murmurs Examination of peripheral vascular system by observation and palpation is normal  EYES: Ophthalmoscopic exam of optic discs and posterior segments is normal; no papilledema or hemorrhages No results found.  MUSCULOSKELETAL: Gait, strength, tone, movements noted in Neurologic exam below  NEUROLOGIC: MENTAL STATUS:      No data to display         awake, alert, oriented to person, place and time recent and remote memory intact normal attention and concentration language fluent, comprehension intact, naming intact fund of knowledge appropriate  CRANIAL NERVE:  2nd - no papilledema on fundoscopic exam 2nd, 3rd, 4th, 6th - pupils equal and reactive to light, visual fields full to confrontation, extraocular muscles intact, no nystagmus 5th - facial sensation symmetric 7th - facial strength symmetric 8th - hearing intact 9th - palate elevates symmetrically, uvula midline 11th - shoulder shrug symmetric 12th - tongue protrusion midline  MOTOR:  normal bulk and tone, full strength in the BUE, BLE  SENSORY:  normal and symmetric to light touch, temperature, vibration  COORDINATION:  finger-nose-finger, fine finger movements normal  REFLEXES:  deep tendon reflexes 1+ and symmetric  GAIT/STATION:  narrow based gait     DIAGNOSTIC DATA (LABS, IMAGING, TESTING) - I reviewed patient records, labs, notes, testing and imaging myself where available.  Lab Results  Component Value Date   WBC 5.5 07/09/2024   HGB 14.7 07/09/2024   HCT 44.0 07/09/2024   MCV 90.6 07/09/2024   PLT 232.0 07/09/2024      Component Value Date/Time   NA 140 07/09/2024 0751   K 4.3 07/09/2024 0751   CL 102 07/09/2024 0751   CO2 29 07/09/2024 0751   GLUCOSE 92 07/09/2024 0751   BUN 24 (H) 07/09/2024 0751    CREATININE 1.31 07/09/2024 0751   CALCIUM 9.5 07/09/2024 0751   PROT 7.3 07/09/2024 0751   ALBUMIN 4.6 07/09/2024 0751   AST 18 07/09/2024 0751   ALT 19 07/09/2024 0751   ALKPHOS 68 07/09/2024 0751   BILITOT 0.7 07/09/2024 0751   Lab Results  Component Value Date   CHOL 158 07/09/2024   HDL 45.70 07/09/2024   LDLCALC 95 07/09/2024   LDLDIRECT 112.0 01/04/2017   TRIG 82.0 07/09/2024   CHOLHDL 3 07/09/2024   Lab Results  Component Value Date   HGBA1C 5.9 07/09/2024   Lab Results  Component Value Date   VITAMINB12 428 07/09/2024   Lab Results  Component Value Date   TSH 1.73 07/09/2024    09/16/24 MRI brain [I reviewed images myself and agree with interpretation. -VRP]  1. No acute intracranial abnormality. 2. Mild-to-moderate mucosal disease in the ethmoid, maxillary, and  sphenoid sinuses.    ASSESSMENT AND PLAN  48 y.o. year old male here with constellation of neurologic symptoms including numbness, cramps, twitching, word finding difficulties since approximately 2023.  Also remote history of seizure disorder, now with recurrent syncope events.  Will proceed with further workup to rule out underlying neuromuscular causes.  CNS vascular, inflammatory, autoimmune less likely with normal MRI of the brain on 09/16/2024.   Dx:  1. Syncope, unspecified syncope type   2. Seizure disorder (HCC)   3. Muscle twitch   4. Numbness      PLAN:  INTERMITTENT MUSCLE TWITCHING / CRAMPS / NUMBNESS - check EMG/NCS   WORD FINDING DIFF - try to improve sleep hygiene  RECURRENT SYNCOPE / LOC EVENTS (last event May 2025) - check EEG - refer to cardiology consult  HISTORY OF SEIZURES (last event age 33 years old) - previously on dilantin; last event 48 years old - check EEG  Orders Placed This Encounter  Procedures   Ambulatory referral to Cardiology   EEG adult   NCV with EMG(electromyography)   Return for for NCV/EMG.  I reviewed images, labs, notes, records  myself. I summarized findings and reviewed with patient, for this high risk condition (recurrent syncope, h/o seizure disorder) requiring high complexity decision making.    EDUARD FABIENE HANLON, MD 10/05/2024, 4:12 PM Certified in Neurology, Neurophysiology and Neuroimaging  [facility] [address] [place], [place] [address] [phone]

## 2024-10-07 ENCOUNTER — Ambulatory Visit: Attending: Nurse Practitioner | Admitting: Nurse Practitioner

## 2024-10-07 ENCOUNTER — Ambulatory Visit: Attending: Nurse Practitioner

## 2024-10-07 VITALS — BP 119/70 | HR 52 | Ht 72.0 in | Wt 175.6 lb

## 2024-10-07 DIAGNOSIS — R001 Bradycardia, unspecified: Secondary | ICD-10-CM | POA: Diagnosis not present

## 2024-10-07 DIAGNOSIS — R7303 Prediabetes: Secondary | ICD-10-CM

## 2024-10-07 DIAGNOSIS — R42 Dizziness and giddiness: Secondary | ICD-10-CM

## 2024-10-07 DIAGNOSIS — Z87898 Personal history of other specified conditions: Secondary | ICD-10-CM | POA: Diagnosis not present

## 2024-10-07 DIAGNOSIS — R55 Syncope and collapse: Secondary | ICD-10-CM

## 2024-10-07 NOTE — Patient Instructions (Signed)
 Medication Instructions:  Your physician recommends that you continue on your current medications as directed. Please refer to the Current Medication list given to you today.  *If you need a refill on your cardiac medications before your next appointment, please call your pharmacy*  Lab Work: None ordered  If you have labs (blood work) drawn today and your tests are completely normal, you will receive your results only by: MyChart Message (if you have MyChart) OR A paper copy in the mail If you have any lab test that is abnormal or we need to change your treatment, we will call you to review the results.  Testing/Procedures: Your physician has requested that you have an echocardiogram. Echocardiography is a painless test that uses sound waves to create images of your heart. It provides your doctor with information about the size and shape of your heart and how well your heart's chambers and valves are working. This procedure takes approximately one hour. There are no restrictions for this procedure. Please do NOT wear cologne, perfume, aftershave, or lotions (deodorant is allowed). Please arrive 15 minutes prior to your appointment time.  Please note: We ask at that you not bring children with you during ultrasound (echo/ vascular) testing. Due to room size and safety concerns, children are not allowed in the ultrasound rooms during exams. Our front office staff cannot provide observation of children in our lobby area while testing is being conducted. An adult accompanying a patient to their appointment will only be allowed in the ultrasound room at the discretion of the ultrasound technician under special circumstances. We apologize for any inconvenience.   Your physician has requested that you have a carotid duplex. This test is an ultrasound of the carotid arteries in your neck. It looks at blood flow through these arteries that supply the brain with blood. Allow one hour for this exam. There  are no restrictions or special instructions.    ZIO AT Long term monitor-Live Telemetry  Your physician has requested you wear a ZIO patch monitor for 14 days.  This is a single patch monitor. [employer] supplies one patch monitor per enrollment. Additional  stickers are not available.  Please do not apply patch if you will be having a Nuclear Stress Test, Echocardiogram, Cardiac CT, MRI,  or Chest Xray during the period you would be wearing the monitor. The patch cannot be worn during  these tests. You cannot remove and re-apply the ZIO AT patch monitor.  Your ZIO patch monitor will be mailed 3 day USPS to your address on file. It may take 3-5 days to  receive your monitor after you have been enrolled.  Once you have received your monitor, please review the enclosed instructions. Your monitor has  already been registered assigning a specific monitor serial # to you.   Billing and Patient Assistance Program information  [place] has been supplied with any insurance information on record for billing. [employer] offers a sliding scale Patient Assistance Program for patients without insurance, or whose  insurance does not completely cover the cost of the ZIO patch monitor. You must apply for the  Patient Assistance Program to qualify for the discounted rate. To apply, call [employer] at [phone],  select option 4, select option 2 , ask to apply for the Patient Assistance Program, (you can request an  interpreter if needed). [employer] will ask your household income and how many people are in your  household. [employer] will quote your out-of-pocket cost based on this information. They will  also be able  to set up a 12 month interest free payment plan if needed.  Applying the monitor   Shave hair from upper left chest.  Hold the abrader disc by orange tab. Rub the abrader in 40 strokes over left upper chest as indicated in  your monitor instructions.  Clean area with 4 enclosed alcohol pads. Use all  pads to ensure the area is cleaned thoroughly. Let  dry.  Apply patch as indicated in monitor instructions. Patch will be placed under collarbone on left side of  chest with arrow pointing upward.  Rub patch adhesive wings for 2 minutes. Remove the white label marked 1. Remove the white label  marked 2. Rub patch adhesive wings for 2 additional minutes.  While looking in a mirror, press and release button in center of patch. A small green light will flash 3-4  times. This will be your only indicator that the monitor has been turned on.  Do not shower for the first 24 hours. You may shower after the first 24 hours.  Press the button if you feel a symptom. You will hear a small click. Record Date, Time and Symptom in  the Patient Log.   Starting the Gateway  In your kit there is a audiological scientist box the size of a cellphone. This is [employer]. It transmits all your  recorded data to [facility]. This box must always stay within 10 feet of you. Open the box and push the *  button. There will be a light that blinks orange and then green a few times. When the light stops  blinking, the Gateway is connected to the ZIO patch. Call [employer] at [phone] to confirm your monitor is transmitting.  Returning your monitor  Remove your patch and place it inside the Gateway. In the lower half of the Gateway there is a white  bag with prepaid postage on it. Place Gateway in bag and seal. Mail package back to [place] as soon as  possible. Your physician should have your final report approximately 7 days after you have mailed back  your monitor. Call [facility] Customer Care at [phone] if you have questions regarding your ZIO AT  patch monitor. Call them immediately if you see an orange light blinking on your monitor.  If your monitor falls off in less than 4 days, contact our Monitor department at [phone]. If your  monitor becomes loose or falls off after 4 days call [employer] at  [phone] for suggestions on  securing your monitor   Follow-Up: At [facility], you and your health needs are our priority.  As part of our continuing mission to provide you with exceptional heart care, our providers are all part of one team.  This team includes your primary Cardiologist (physician) and Advanced Practice Providers or APPs (Physician Assistants and Nurse Practitioners) who all work together to provide you with the care you need, when you need it.  Your next appointment:   2 month(s)  Provider:   You will see one of the following Advanced Practice Providers on your designated Care Team:   Lonni Meager, NP Lesley Maffucci, PA-C Bernardino Bring, PA-C Cadence [place], PA-C Tylene Lunch, NP Barnie Hila, NP      We recommend signing up for the patient portal called MyChart.  Sign up information is provided on this After Visit Summary.  MyChart is used to connect with patients for Virtual Visits (Telemedicine).  Patients are able to view lab/test results, encounter  notes, upcoming appointments, etc.  Non-urgent messages can be sent to your provider as well.   To learn more about what you can do with MyChart, go to [email or web address].   Other Instructions

## 2024-10-07 NOTE — Progress Notes (Signed)
 Office Visit    Patient Name: Jay Price. Date of Encounter: 10/07/2024  Primary Care Provider:  Watt Mirza, MD Primary Cardiologist:  None  Chief Complaint    48 year old male with a history of recurrent syncope, prediabetes and seizures who presents for heart first clinic new pt evaluation.   Past Medical History    Past Medical History:  Diagnosis Date   Dysplastic nevus 09/15/2018   left low back 9.5 cm lat to spine, moderate atypia   Dysplastic nevus 09/15/2018   left upper back paraspinal, mild atypia   Seizures (HCC)    Distantly, had about 5 seizures, usually associated with great fatigue and strain. Last 10 years ago. Tonic-clonic activity described. Post-ictal state.   Past Surgical History:  Procedure Laterality Date   COLONOSCOPY WITH PROPOFOL  N/A 05/02/2022   Procedure: COLONOSCOPY WITH PROPOFOL ;  Surgeon: Therisa Bi, MD;  Location: [facility] ENDOSCOPY;  Service: Gastroenterology;  Laterality: N/A;    Allergies  No Known Allergies   Labs/Other Studies Reviewed    The following studies were reviewed today:     Recent Labs: 07/09/2024: ALT 19; BUN 24; Creatinine, Ser 1.31; Hemoglobin 14.7; Platelets 232.0; Potassium 4.3; Sodium 140; TSH 1.73  Recent Lipid Panel    Component Value Date/Time   CHOL 158 07/09/2024 0751   TRIG 82.0 07/09/2024 0751   HDL 45.70 07/09/2024 0751   CHOLHDL 3 07/09/2024 0751   VLDL 16.4 07/09/2024 0751   LDLCALC 95 07/09/2024 0751   LDLDIRECT 112.0 01/04/2017 0812    History of Present Illness    48 year old male with the above past medical history including recurrent syncope, prediabetes, and seizures.  He has a history of seizures as a child.  He saw his PCP in August 2025 and reported a myriad of concerns including tingling in his hands and feet, slurred speech, dysphagia, difficulty finding words, and syncope.  He was referred to neurology.  Given concern for multiple syncopal episodes without prodrome, he was  referred to cardiology.  He is pending EEG per neurology.  He presents today for heart first clinic new patient evaluation. He reports a longstanding history of syncope which he describes as blackouts.  No prodrome.  His last syncopal episode was in 11/2023.  He states he was told he had a heart murmur as a child.  Prior workup in the setting of syncope when he was a child/teenager was otherwise unremarkable per pt report. He denies any chest pain, palpitations, dizziness, dyspnea, edema, PND, orthopnea, or weight gain. He is divorced, he works 2 jobs, approximately 16 hours a day.  He works as a tax adviser for [employer], he also works water quality scientist for [employer]  He lives with his daughter and her boyfriend (he has 2 children ages 42 and 82).  His daughter is in nursing school at [employer]. He drinks 2 cups of coffee a day.  He exercises lifting weights 2-3 times a week.  Denies any tobacco use drug use, no alcohol use.  No significant family history. Overall, he reports feeling well.   Home Medications    No current outpatient medications on file.   No current facility-administered medications for this visit.     Review of Systems    He denies chest pain, palpitations, dyspnea, pnd, orthopnea, n, v, dizziness, syncope, edema, weight gain, or early satiety. All other systems reviewed and are otherwise negative except as noted above.   Physical Exam    VS:  BP 119/70   Pulse ROLLEN)  52   Ht 6' (1.829 m)   Wt 175 lb 9.6 oz (79.7 kg)   SpO2 100%   BMI 23.82 kg/m   GEN: Well nourished, well developed, in no acute distress. HEENT: normal. Neck: Supple, no JVD, carotid bruits, or masses. Cardiac: RRR, no murmurs, rubs, or gallops. No clubbing, cyanosis, edema.  Radials/DP/PT 2+ and equal bilaterally.  Respiratory:  Respirations regular and unlabored, clear to auscultation bilaterally. GI: Soft, nontender, nondistended, BS + x 4. MS: no deformity or atrophy. Skin: warm and dry, no rash. Neuro:   Strength and sensation are intact. Psych: Normal affect.  Accessory Clinical Findings    ECG personally reviewed by me today - EKG Interpretation Date/Time:  Wednesday October 07 2024 14:58:33 EST Ventricular Rate:  53 PR Interval:  196 QRS Duration:  118 QT Interval:  412 QTC Calculation: 386 R Axis:   59  Text Interpretation: Sinus bradycardia Non-specific intra-ventricular conduction delay No previous ECGs available Confirmed by Daneen Perkins ([id number]) on 10/07/2024 3:13:17 PM  - no acute changes.   Lab Results  Component Value Date   WBC 5.5 07/09/2024   HGB 14.7 07/09/2024   HCT 44.0 07/09/2024   MCV 90.6 07/09/2024   PLT 232.0 07/09/2024   Lab Results  Component Value Date   CREATININE 1.31 07/09/2024   BUN 24 (H) 07/09/2024   NA 140 07/09/2024   K 4.3 07/09/2024   CL 102 07/09/2024   CO2 29 07/09/2024   Lab Results  Component Value Date   ALT 19 07/09/2024   AST 18 07/09/2024   ALKPHOS 68 07/09/2024   BILITOT 0.7 07/09/2024   Lab Results  Component Value Date   CHOL 158 07/09/2024   HDL 45.70 07/09/2024   LDLCALC 95 07/09/2024   LDLDIRECT 112.0 01/04/2017   TRIG 82.0 07/09/2024   CHOLHDL 3 07/09/2024    Lab Results  Component Value Date   HGBA1C 5.9 07/09/2024    Assessment & Plan    1. Recurrent syncope/history of seizures/sinus bradycardia: History of recurrent syncope with no prodrome dating back to childhood.  MRI brain in 08/2024 was unremarkable.  He does have a history of seizures, pending EEG per neurology.  He denies any recent syncope (last syncopal episode was greater than 10 months ago).  The statics negative in office today.  Given history of recurrent syncope, will check echocardiogram.  Will pursue carotid ultrasound.  Will check 14-day live ZIO monitor (he declined 30-day monitor).  We discussed possible ischemic evaluation with coronary CT angiogram versus coronary calcium scoring, through shared decision making, will defer for now.   Reviewed ED precautions.  2. Prediabetes: A1c was 5.9 in 06/2024.  Monitored and managed per PCP.  Encouraged ongoing lifestyle modifications with diet and exercise.  3. Disposition:  Follow-up in 2 months with APP in [place] (he lives in [place], [place], prefers to follow-up in [place]).    Perkins JAYSON Daneen, NP 10/12/2024, 4:51 PM

## 2024-10-07 NOTE — Progress Notes (Unsigned)
 Enrolled for [employer] to mail a ZIO AT Live Telemetry monitor to patients address on file.   Requested delivery 10/14/24.  DOD to read.

## 2024-10-12 ENCOUNTER — Encounter: Payer: Self-pay | Admitting: Nurse Practitioner

## 2024-10-19 ENCOUNTER — Other Ambulatory Visit: Admitting: *Deleted

## 2024-10-19 ENCOUNTER — Ambulatory Visit: Admitting: Diagnostic Neuroimaging

## 2024-10-19 DIAGNOSIS — R55 Syncope and collapse: Secondary | ICD-10-CM

## 2024-10-19 DIAGNOSIS — G40909 Epilepsy, unspecified, not intractable, without status epilepticus: Secondary | ICD-10-CM

## 2024-11-04 ENCOUNTER — Telehealth: Payer: Self-pay | Admitting: *Deleted

## 2024-11-10 NOTE — Procedures (Signed)
"  ° °  [facility]  EEG (ELECTROENCEPHALOGRAM) REPORT   STUDY DATE: 11/10/24 PATIENT NAME: Jay Price. DOB: 1976-07-16 MRN: [id number]  ORDERING CLINICIAN: Eduard Hanlon, MD   TECHNOLOGIST: MARLA Plummer TECHNIQUE: Electroencephalogram was recorded utilizing standard 10-20 system of lead placement and reformatted into average and bipolar montages.  RECORDING TIME: 28 minutes ACTIVATION: hyperventilation and photic stimulation  CLINICAL INFORMATION: 49 year old male with syncope vs seizure.  FINDINGS: Posterior dominant background rhythms, which attenuate with eye opening, ranging 10-11 hertz and 30-35 microvolts. No focal, lateralizing, epileptiform activity or seizures are seen. Patient recorded in the awake and drowsy state. EKG channel shows regular rhythm of 55-60 beats per minute.   IMPRESSION:   Normal EEG in the awake and drowsy states.    INTERPRETING PHYSICIAN:  EDUARD FABIENE HANLON, MD Certified in Neurology, Neurophysiology and Neuroimaging  [facility] [address] [place], [place] [address] [phone]  "

## 2024-11-20 ENCOUNTER — Ambulatory Visit: Attending: Nurse Practitioner

## 2024-11-20 ENCOUNTER — Ambulatory Visit

## 2024-11-20 DIAGNOSIS — R55 Syncope and collapse: Secondary | ICD-10-CM

## 2024-11-20 DIAGNOSIS — R001 Bradycardia, unspecified: Secondary | ICD-10-CM

## 2024-11-20 LAB — ECHOCARDIOGRAM COMPLETE
Area-P 1/2: 3.74 cm2
S' Lateral: 4.05 cm

## 2024-11-25 ENCOUNTER — Ambulatory Visit: Payer: Self-pay | Admitting: Nurse Practitioner

## 2024-11-30 ENCOUNTER — Ambulatory Visit: Payer: Self-pay | Admitting: Diagnostic Neuroimaging

## 2024-12-01 NOTE — Telephone Encounter (Signed)
Done. -VRP 

## 2024-12-03 DIAGNOSIS — R55 Syncope and collapse: Secondary | ICD-10-CM | POA: Diagnosis not present

## 2024-12-03 DIAGNOSIS — R42 Dizziness and giddiness: Secondary | ICD-10-CM | POA: Diagnosis not present

## 2024-12-10 ENCOUNTER — Ambulatory Visit: Admitting: Nurse Practitioner

## 2024-12-10 ENCOUNTER — Encounter: Payer: Self-pay | Admitting: Nurse Practitioner

## 2024-12-10 VITALS — BP 126/78 | HR 60 | Ht 71.0 in | Wt 178.6 lb

## 2024-12-10 DIAGNOSIS — R7303 Prediabetes: Secondary | ICD-10-CM

## 2024-12-10 DIAGNOSIS — R55 Syncope and collapse: Secondary | ICD-10-CM | POA: Diagnosis not present

## 2024-12-10 DIAGNOSIS — R001 Bradycardia, unspecified: Secondary | ICD-10-CM | POA: Diagnosis not present

## 2024-12-10 NOTE — Patient Instructions (Signed)
 Medication Instructions:  Your physician recommends that you continue on your current medications as directed. Please refer to the Current Medication list given to you today.    *If you need a refill on your cardiac medications before your next appointment, please call your pharmacy*  Lab Work: No labs ordered today    Testing/Procedures: No test ordered today   Follow-Up: At [facility], you and your health needs are our priority.  As part of our continuing mission to provide you with exceptional heart care, our providers are all part of one team.  This team includes your primary Cardiologist (physician) and Advanced Practice Providers or APPs (Physician Assistants and Nurse Practitioners) who all work together to provide you with the care you need, when you need it.  Your next appointment:   As needed  Provider:   Lonni Meager, NP

## 2024-12-10 NOTE — Progress Notes (Signed)
 "    Office Visit    Patient Name: Jay Price. Date of Encounter: 12/10/2024  Primary Care Provider:  Watt Mirza, MD Primary Cardiologist:  New to [facility]    Chief Complaint    49 y.o. male with a history of recurrent syncope, prediabetes, sinus bradycardia, and seizures, who presents for follow-up after recent testing.  Past Medical History   Subjective   Past Medical History:  Diagnosis Date   Diastolic dysfunction    a. 11/2024 Echo: EF 55-60%, no rwma, GrI DD, nl RV fxn, no significant valvular dzs.   Dysplastic nevus 09/15/2018   left low back 9.5 cm lat to spine, moderate atypia   Dysplastic nevus 09/15/2018   left upper back paraspinal, mild atypia   Seizures (HCC)    Distantly, had about 5 seizures, usually associated with great fatigue and strain. Last 10 years ago. Tonic-clonic activity described. Post-ictal state.   Syncope    a. 09/2024 Zio: Predominantly sinus rhythm, average 63 (34-164).  Minimum heart rate 34 at 3:47 AM.  15% bradycardia burden.  Rare PACs/PVCs.  Triggered events associate with sinus rhythm and PACs; b. 11/2024 Carotid U/S: nl.   Past Surgical History:  Procedure Laterality Date   COLONOSCOPY WITH PROPOFOL  N/A 05/02/2022   Procedure: COLONOSCOPY WITH PROPOFOL ;  Surgeon: Therisa Bi, MD;  Location: [facility] ENDOSCOPY;  Service: Gastroenterology;  Laterality: N/A;    Allergies  Allergies[1]     History of Present Illness      49 y.o. y/o male with a history of recurrent syncope, prediabetes, sinus bradycardia, and childhood seizures.  He saw his primary care provider in August 2025 complaints of tingling in his hands and feet, slurred speech, dysphagia, difficulty finding words, and syncope.  He was referred to neurology.  MRI of the brain in October 2025 showed no acute intracranial abnormality.  He was subsequently scheduled for EEG, which he says is unremarkable.    He establish care with cardiology and November 2025 at which  time he reported a long history of syncope which she described as blackouts.  Symptoms historically occurred suddenly without prodrome, the last of which occurring in January 2025.  He reportedly underwent evaluation as a child/teenager but is unaware of any remarkable findings.  Following his cardiology visit, he underwent ZIO monitoring which showed predominantly sinus rhythm with an average heart rate of 63 bpm and a minimum heart rate of 34 bpm at 3:47 AM.  Had a 15% bradycardia burden but no high-grade heart block, significant pauses, or significant/prolonged arrhythmias.  Echocardiogram in January 2026 showed an EF of 55 to 60% with grade 1 diastolic dysfunction, normal RV function, and no significant valvular disease.  Carotid ultrasound was also undertaken in January 2026, and showed normal carotid arteries bilaterally.   Since his index cardiology visit, Jay Price has remained active without symptoms or limitations.  He did follow-up with neurology and is scheduled for a nerve study.  He has not had any recurrent presyncope or syncope.  He has had some cramping in his feet and notes that he is not particularly good at maintaining his hydration throughout the day.  He denies chest pain, dyspnea, palpitations, PND, orthopnea, dizziness, edema, or early satiety.  In light of nocturnal bradycardia, we did discuss potential existence of sleep apnea.  Patient thinks he snores and does sometimes feel tired throughout the day though notes that he only sleeps 4 to 5 hours a day and works 2 jobs.  He might  be interested in a sleep study and will follow-up with his neurologist. Objective   Home Medications    No current outpatient medications on file.   No current facility-administered medications for this visit.     Physical Exam    VS:  BP 126/78 (BP Location: Left Arm, Patient Position: Sitting)   Pulse 60   Ht 5' 11 (1.803 m)   Wt 178 lb 9.6 oz (81 kg)   SpO2 99%   BMI 24.91 kg/m  , BMI  Body mass index is 24.91 kg/m.    STOP-Bang Score:  3        GEN: Well nourished, well developed, in no acute distress. HEENT: normal. Neck: Supple, no JVD, carotid bruits, or masses. Cardiac: RRR, no murmurs, rubs, or gallops. No clubbing, cyanosis, edema.  Radials 2+/PT 2+ and equal bilaterally.  Respiratory:  Respirations regular and unlabored, clear to auscultation bilaterally. GI: Soft, nontender, nondistended, BS + x 4. MS: no deformity or atrophy. Skin: warm and dry, no rash. Neuro:  Strength and sensation are intact. Psych: Normal affect.  Accessory Clinical Findings   Lab Results  Component Value Date   WBC 5.5 07/09/2024   HGB 14.7 07/09/2024   HCT 44.0 07/09/2024   MCV 90.6 07/09/2024   PLT 232.0 07/09/2024   Lab Results  Component Value Date   CREATININE 1.31 07/09/2024   BUN 24 (H) 07/09/2024   NA 140 07/09/2024   K 4.3 07/09/2024   CL 102 07/09/2024   CO2 29 07/09/2024   Lab Results  Component Value Date   ALT 19 07/09/2024   AST 18 07/09/2024   ALKPHOS 68 07/09/2024   BILITOT 0.7 07/09/2024   Lab Results  Component Value Date   CHOL 158 07/09/2024   HDL 45.70 07/09/2024   LDLCALC 95 07/09/2024   LDLDIRECT 112.0 01/04/2017   TRIG 82.0 07/09/2024   CHOLHDL 3 07/09/2024    Lab Results  Component Value Date   HGBA1C 5.9 07/09/2024   Lab Results  Component Value Date   TSH 1.73 07/09/2024       Assessment & Plan    1.  Syncope: Patient with a history of syncope dating back to childhood with reportedly unremarkable workup as a child/teenager.  Most recent syncopal spell in January 2025, occurring shortly after awakening 1 morning, prompting referral to cardiology in late 2025.  ZIO monitoring in November 2025 did not show any significant arrhythmias, pauses, or evidence of high-grade heart block.  Echocardiogram earlier this month showed an EF of 55 to 60% with grade 1 diastolic dysfunction, normal RV function, and no significant valvular  disease.  Finally, carotid ultrasound earlier this month showed normal bilateral carotids.  Since last visit, he has done well without any recurrent presyncope or syncope.  We discussed the results of his testing today.  In light of nocturnal bradycardia rates into the 30s, I did query his sleep habits and he notes that he usually sleeps for 5 hours at night and thinks he does snore.  He plans to follow-up with his neurologist to consider a sleep study.  No further cardiac evaluation is warranted at this time.  2.  Nocturnal bradycardia: Rates down to 37 noted on monitoring.  He says that he does snore and does have some daytime somnolence.  STOP-BANG calculates to 3.  I offered referral to pulmonology to assess for sleep apnea.  He will instead discussed with his neurologist.    3.  Prediabetes: A1c 5.9 in  August 2025.  Followed by primary care.  4.  Disposition: Follow-up as needed.  Lonni Meager, NP 12/10/2024, 4:41 PM     [1] No Known Allergies  "

## 2024-12-30 ENCOUNTER — Encounter: Admitting: Neurology

## 2025-06-22 ENCOUNTER — Ambulatory Visit: Admitting: Dermatology
